# Patient Record
Sex: Male | Born: 1980 | Race: White | Hispanic: No | Smoking: Current every day smoker
Health system: Southern US, Community
[De-identification: ages and names within clinical notes are randomized; demographics above are authoritative.]

## PROBLEM LIST (undated history)

## (undated) DIAGNOSIS — E119 Type 2 diabetes mellitus without complications: Secondary | ICD-10-CM

## (undated) DIAGNOSIS — E162 Hypoglycemia, unspecified: Secondary | ICD-10-CM

## (undated) DIAGNOSIS — I10 Essential (primary) hypertension: Secondary | ICD-10-CM

## (undated) HISTORY — PX: MOUTH SURGERY: SHX715

---

## 2011-08-29 ENCOUNTER — Emergency Department (HOSPITAL_COMMUNITY)
Admission: EM | Admit: 2011-08-29 | Discharge: 2011-08-29 | Disposition: A | Discharge status: Home / Self Care | Payer: Self-pay | Attending: Emergency Medicine | Admitting: Emergency Medicine

## 2011-08-29 ENCOUNTER — Encounter (HOSPITAL_COMMUNITY): Payer: Self-pay | Admitting: Emergency Medicine

## 2011-08-29 DIAGNOSIS — K0889 Other specified disorders of teeth and supporting structures: Secondary | ICD-10-CM

## 2011-08-29 DIAGNOSIS — K089 Disorder of teeth and supporting structures, unspecified: Secondary | ICD-10-CM | POA: Insufficient documentation

## 2011-08-29 MED ORDER — OXYCODONE-ACETAMINOPHEN 5-325 MG PO TABS: ORAL_TABLET | ORAL | Status: AC

## 2011-08-29 NOTE — ED Provider Notes (Signed)
Medical screening examination/treatment/procedure(s) were performed by non-physician practitioner and as supervising physician I was immediately available for consultation/collaboration.    Nilsa Macht L Rika Daughdrill, MD 08/29/11 1818 

## 2011-08-29 NOTE — ED Notes (Signed)
Pt c/o dental pain to upper L side of mouth. Pt has started Amoxicillin for same earlier this week. Pt states pain is now worse and radiates to L ear. Pt states dentist is going to pull tooth. Pt states he has been taking Tylenol for pain, but it is not working.

## 2011-08-29 NOTE — ED Provider Notes (Signed)
History     CSN: [id number]  Arrival date & time 08/29/11  1611   First MD Initiated Contact with Patient 08/29/11 1800      Chief Complaint  Patient presents with  . Dental Pain    (Consider location/radiation/quality/duration/timing/severity/associated sxs/prior treatment) HPI 31 y.o. male in no acute distress complaining of pain to left upper tooth with dental abscess. Patient has been to see his dentist and was given amoxicillin however he was not given any pain medication. He states that pain is now worsening and radiating to the left ear. He denies fever chills cheek swelling change in vision.   History reviewed. No pertinent past medical history.  History reviewed. No pertinent past surgical history.  No family history on file.  History  Substance Use Topics  . Smoking status: Not on file  . Smokeless tobacco: Not on file  . Alcohol Use: Not on file      Review of Systems  Unable to perform ROS Constitutional: Negative for fever.  HENT: Positive for dental problem.   Gastrointestinal: Negative for nausea, vomiting and abdominal pain.  All other systems reviewed and are negative.    Allergies  Ibuprofen and Toradol  Home Medications   Current Outpatient Rx  Name Route Sig Dispense Refill  . AMOXICILLIN 500 MG PO CAPS Oral Take 500 mg by mouth 3 (three) times daily.      BP 139/85  Pulse 81  Temp 98.5 F (36.9 C) (Oral)  Resp 17  SpO2 98%  Physical Exam  Nursing note and vitals reviewed. Constitutional: He is oriented to person, place, and time. He appears well-developed and well-nourished. No distress.  HENT:  Head: Normocephalic and atraumatic.       Significant number of dental caries and blackened  tooth to the upper left side.  Eyes: Conjunctivae and EOM are normal.  Cardiovascular: Normal rate.   Pulmonary/Chest: Effort normal.  Abdominal: Soft.  Musculoskeletal: Normal range of motion.  Neurological: He is alert and oriented to  person, place, and time.  Psychiatric: He has a normal mood and affect.    ED Course  Procedures (including critical care time)  Labs Reviewed - No data to display No results found.   1. Pain, dental       MDM  Dental pain with abscess patient is only taking amoxicillin for the abscess he needs pain control I will write him for 10 5 mg Percocet. He has an appointment with his dentist in 2 weeks. Return precautions given        Wynetta Emery, PA-C 08/29/11 1811

## 2013-07-05 ENCOUNTER — Encounter (HOSPITAL_COMMUNITY): Payer: Self-pay | Admitting: Emergency Medicine

## 2013-07-05 ENCOUNTER — Emergency Department (HOSPITAL_COMMUNITY)
Admission: EM | Admit: 2013-07-05 | Discharge: 2013-07-05 | Disposition: A | Discharge status: Home / Self Care | Payer: Self-pay | Attending: Emergency Medicine | Admitting: Emergency Medicine

## 2013-07-05 DIAGNOSIS — K002 Abnormalities of size and form of teeth: Secondary | ICD-10-CM | POA: Insufficient documentation

## 2013-07-05 DIAGNOSIS — K089 Disorder of teeth and supporting structures, unspecified: Secondary | ICD-10-CM | POA: Insufficient documentation

## 2013-07-05 DIAGNOSIS — Z79899 Other long term (current) drug therapy: Secondary | ICD-10-CM | POA: Insufficient documentation

## 2013-07-05 DIAGNOSIS — K0889 Other specified disorders of teeth and supporting structures: Secondary | ICD-10-CM

## 2013-07-05 DIAGNOSIS — K0501 Acute gingivitis, non-plaque induced: Secondary | ICD-10-CM | POA: Insufficient documentation

## 2013-07-05 DIAGNOSIS — F17200 Nicotine dependence, unspecified, uncomplicated: Secondary | ICD-10-CM | POA: Insufficient documentation

## 2013-07-05 DIAGNOSIS — Z792 Long term (current) use of antibiotics: Secondary | ICD-10-CM | POA: Insufficient documentation

## 2013-07-05 DIAGNOSIS — E119 Type 2 diabetes mellitus without complications: Secondary | ICD-10-CM | POA: Insufficient documentation

## 2013-07-05 DIAGNOSIS — K029 Dental caries, unspecified: Secondary | ICD-10-CM | POA: Insufficient documentation

## 2013-07-05 DIAGNOSIS — K0510 Chronic gingivitis, plaque induced: Secondary | ICD-10-CM

## 2013-07-05 HISTORY — DX: Type 2 diabetes mellitus without complications: E11.9

## 2013-07-05 LAB — CBG MONITORING, ED: Glucose-Capillary: 104 mg/dL — ABNORMAL HIGH (ref 70–99)

## 2013-07-05 MED ORDER — AMOXICILLIN 500 MG PO CAPS: 500.0000 mg | ORAL_CAPSULE | Freq: Three times a day (TID) | ORAL | Status: DC

## 2013-07-05 MED ORDER — OXYCODONE-ACETAMINOPHEN 5-325 MG PO TABS: 1.0000 | ORAL_TABLET | Freq: Three times a day (TID) | ORAL | Status: DC | PRN

## 2013-07-05 MED ORDER — OXYCODONE-ACETAMINOPHEN 5-325 MG PO TABS
1.0000 | ORAL_TABLET | Freq: Once | ORAL | Status: AC
  Administered 2013-07-05: 1 via ORAL
  Filled 2013-07-05: qty 1

## 2013-07-05 NOTE — ED Provider Notes (Signed)
CSN: [id number]     Arrival date & time 07/05/13  1132 History   This chart was scribed for non-physician practitioner Raymon MuttonMarissa Sciacca, PA-C, working with Hurman HornJohn M Bednar, MD, by Yevette EdwardsAngela Bracken, ED Scribe. This patient was seen in room WTR6/WTR6 and the patient's care was started at 12:45 PM.  First MD Initiated Contact with Patient 07/05/13 1142     Chief Complaint  Patient presents with  . Dental Pain    The history is provided by the patient. No language interpreter was used.   HPI Comments Vincent Cannon is a 33 y.o. male, with a h/o DM, who presents to the Emergency Department complaining of upper frontal dental pain which has persisted for three days, and worsened this morning. He characterizes the pain as "throbbing," and he rates the pain as 8/10.  He denies recent injury to the affected tooth, though he states that his teeth have been discolored for approximately a year. He denies fever, chills, nausea, emesis, neck pain, neck stiffness, facial swelling, gum swelling, bleeding or drainage from the site, difficulty swallowing, voice changes, or throat swelling. He has used an old prescription of amoxacillin, tylenol, and Goody's power without relief. The pt does not have dental insurance and cannot afford a dental visit until his insurance plan is enacted; his last visit was approximately 18 months ago.  Vincent Cannon is a current smoker.   Past Medical History  Diagnosis Date  . Diabetes mellitus without complication    No past surgical history on file. No family history on file. History  Substance Use Topics  . Smoking status: Current Every Day Smoker -- 1.00 packs/day    Types: Cigarettes  . Smokeless tobacco: Never Used  . Alcohol Use: No    Review of Systems  Constitutional: Negative for fever and chills.  HENT: Positive for dental problem. Negative for drooling, facial swelling, sore throat, trouble swallowing and voice change.   Gastrointestinal: Negative for nausea and  vomiting.  Musculoskeletal: Negative for neck pain and neck stiffness.  All other systems reviewed and are negative.   Allergies  Ibuprofen and Toradol  Home Medications   Prior to Admission medications   Medication Sig Start Date End Date Taking? Authorizing Provider  amoxicillin (AMOXIL) 500 MG capsule Take 1 capsule (500 mg total) by mouth 3 (three) times daily. 07/05/13   Marissa Sciacca, PA-C  oxyCODONE-acetaminophen (PERCOCET/ROXICET) 5-325 MG per tablet Take 1 tablet by mouth every 8 (eight) hours as needed for moderate pain or severe pain. 07/05/13   Marissa Sciacca, PA-C   Triage Vitals: BP 135/101  Pulse 99  Temp(Src) 98.1 F (36.7 C) (Oral)  Resp 18  SpO2 100%  Physical Exam  Nursing note and vitals reviewed. Constitutional: He is oriented to person, place, and time. He appears well-developed and well-nourished. No distress.  HENT:  Head: Normocephalic and atraumatic.  Mouth/Throat: Uvula is midline and oropharynx is clear and moist. He does not have dentures. No oral lesions. No trismus in the jaw. Abnormal dentition. Dental caries present. No dental abscesses, uvula swelling or lacerations. No oropharyngeal exudate, posterior oropharyngeal edema, posterior oropharyngeal erythema or tonsillar abscesses.    Poor dentition identified with numerous teeth in the maxillary jawline rotting and decayed. Bilateral central incisors, bilateral lateral incisors, bilateral canines of maxillary jawline noted to be diagrammed, and decayed. Mild swelling identified to the maxillary jawline with negative active drainage or bleeding noted. Negative erythema identified. Negative sublingual lesions. Uvula midline with symmetrical elevation-negative uvula swelling. Negative trismus.  Eyes: Conjunctivae and EOM are normal. Pupils are equal, round, and reactive to light. Right eye exhibits no discharge. Left eye exhibits no discharge.  Neck: Normal range of motion. Neck supple. No tracheal  deviation present.  Negative neck stiffness Negative nuchal rigidity Negative cervical lymphadenopathy  Negative meningeal signs   Cardiovascular: Normal rate, regular rhythm and normal heart sounds.  Exam reveals no friction rub.   No murmur heard. Pulmonary/Chest: Effort normal and breath sounds normal. No respiratory distress. He has no wheezes. He has no rales.  Patient is able to speak in full sentences without difficulty Negative use of accessory muscles Negative stridor  Musculoskeletal: Normal range of motion. He exhibits no tenderness.  Full ROM to upper and lower extremities without difficulty noted, negative ataxia noted.  Lymphadenopathy:    He has no cervical adenopathy.  Neurological: He is alert and oriented to person, place, and time. No cranial nerve deficit. He exhibits normal muscle tone. Coordination normal.  Cranial nerves III-XII grossly intact  Skin: Skin is warm and dry.  Psychiatric: He has a normal mood and affect. His behavior is normal.    ED Course  Procedures (including critical care time)  DIAGNOSTIC STUDIES: Oxygen Saturation is 100% on room air, normal by my interpretation.    COORDINATION OF CARE:  12:52 PM- Discussed treatment plan with patient, and the patient agreed to the plan. The plan includes a prescription for amoxicillin and pain medication. Also provide pt referral to a dentist and oral surgeon.   Labs Review Labs Reviewed  CBG MONITORING, ED - Abnormal; Notable for the following:    Glucose-Capillary 104 (*)    All other components within normal limits    Imaging Review No results found.   EKG Interpretation None      MDM   Final diagnoses:  Pain, dental  Gingivitis    Medications  oxyCODONE-acetaminophen (PERCOCET/ROXICET) 5-325 MG per tablet 1 tablet (1 tablet Oral Given 07/05/13 1301)   Filed Vitals:   07/05/13 1139  BP: 135/101  Pulse: 99  Temp: 98.1 F (36.7 C)  TempSrc: Oral  Resp: 18  SpO2: 100%   I  personally performed the services described in this documentation, which was scribed in my presence. The recorded information has been reviewed and is accurate.  Doubt Ludwig's angina. Doubt peritonsillar abscess. Doubt retropharyngeal abscess. Patient presenting to the ED with dental pain secondary to poor dental hygiene. Numerous teeth identified to the maxillary region that are diagrammed and decaying. Patient stable, afebrile. Patient not septic appearing. Discharged patient. [facility]. Discharged patient with antibiotics and pain medications-discussed course, precautions, disposal technique. Discussed with patient to closely monitor symptoms and if symptoms are to worsen or change to report back to the ED - strict return instructions given.  Patient agreed to plan of care, understood, all questions answered.   Raymon MuttonMarissa Sciacca, PA-C 07/05/13 2118

## 2013-07-05 NOTE — ED Notes (Signed)
Pt has dental pain that has increased over the last several days. Pt does not have a dentist at this time due to insurance.

## 2013-07-05 NOTE — Discharge Instructions (Signed)
Please call and set up an appointment with the dentist Please rest and stay hydrated Please apply cool compressions Please take medications, antibiotics as prescribed and on a full stomach Please take pain medications as prescribed rash on pain medications his be no drinking alcohol, driving, operating any heavy machinery. If there is extra please disposer proper manner. Please do not take any extra Tylenol with this medication for this can lead to Tylenol overdose and liver issues. Please continue to monitor symptoms closely and if symptoms are to worsen or change (fever greater than 101, chills, chest pain, shortness of breath, difficulty breathing, numbness, tingling, worsening or changes to pain pattern, swelling, redness to the skin, pus drainage, bleeding, inability to swallow, throat closing sensation, inability to open and close the jaw line, neck pain, neck swelling) please report back to the ED immediately    Dental Pain A tooth ache may be caused by cavities (tooth decay). Cavities expose the nerve of the tooth to air and hot or cold temperatures. It may come from an infection or abscess (also called a boil or furuncle) around your tooth. It is also often caused by dental caries (tooth decay). This causes the pain you are having. DIAGNOSIS  Your caregiver can diagnose this problem by exam. TREATMENT   If caused by an infection, it may be treated with medications which kill germs (antibiotics) and pain medications as prescribed by your caregiver. Take medications as directed.  Only take over-the-counter or prescription medicines for pain, discomfort, or fever as directed by your caregiver.  Whether the tooth ache today is caused by infection or dental disease, you should see your dentist as soon as possible for further care. SEEK MEDICAL CARE IF: The exam and treatment you received today has been provided on an emergency basis only. This is not a substitute for complete medical or  dental care. If your problem worsens or new problems (symptoms) appear, and you are unable to meet with your dentist, call or return to this location. SEEK IMMEDIATE MEDICAL CARE IF:   You have a fever.  You develop redness and swelling of your face, jaw, or neck.  You are unable to open your mouth.  You have severe pain uncontrolled by pain medicine. MAKE SURE YOU:   Understand these instructions.  Will watch your condition.  Will get help right away if you are not doing well or get worse. Document Released: 12/24/2004 Document Revised: 03/18/2011 Document Reviewed: 08/12/2007 [facility] Patient Information 2015 [place], [place]. This information is not intended to replace advice given to you by your health care provider. Make sure you discuss any questions you have with your health care provider.  Dental Care and Dentist Visits Dental care supports good overall health. Regular dental visits can also help you avoid dental pain, bleeding, infection, and other more serious health problems in the future. It is important to keep the mouth healthy because diseases in the teeth, gums, and other oral tissues can spread to other areas of the body. Some problems, such as diabetes, heart disease, and pre-term labor have been associated with poor oral health.  See your dentist every 6 months. If you experience emergency problems such as a toothache or broken tooth, go to the dentist right away. If you see your dentist regularly, you may catch problems early. It is easier to be treated for problems in the early stages.  WHAT TO EXPECT AT A DENTIST VISIT  Your dentist will look for many common oral health problems  and recommend proper treatment. At your regular dental visit, you can expect:  Gentle cleaning of the teeth and gums. This includes scraping and polishing. This helps to remove the sticky substance around the teeth and gums (plaque). Plaque forms in the mouth shortly after eating. Over time,  plaque hardens on the teeth as tartar. If tartar is not removed regularly, it can cause problems. Cleaning also helps remove stains.  Periodic X-rays. These pictures of the teeth and supporting bone will help your dentist assess the health of your teeth.  Periodic fluoride treatments. Fluoride is a natural mineral shown to help strengthen teeth. Fluoride treatmentinvolves applying a fluoride gel or varnish to the teeth. It is most commonly done in children.  Examination of the mouth, tongue, jaws, teeth, and gums to look for any oral health problems, such as:  Cavities (dental caries). This is decay on the tooth caused by plaque, sugar, and acid in the mouth. It is best to catch a cavity when it is small.  Inflammation of the gums caused by plaque buildup (gingivitis).  Problems with the mouth or malformed or misaligned teeth.  Oral cancer or other diseases of the soft tissues or jaws. KEEP YOUR TEETH AND GUMS HEALTHY For healthy teeth and gums, follow these general guidelines as well as your dentist's specific advice:  Have your teeth professionally cleaned at the dentist every 6 months.  Brush twice daily with a fluoride toothpaste.  Floss your teeth daily.  Ask your dentist if you need fluoride supplements, treatments, or fluoride toothpaste.  Eat a healthy diet. Reduce foods and drinks with added sugar.  Avoid smoking. TREATMENT FOR ORAL HEALTH PROBLEMS If you have oral health problems, treatment varies depending on the conditions present in your teeth and gums.  Your caregiver will most likely recommend good oral hygiene at each visit.  For cavities, gingivitis, or other oral health disease, your caregiver will perform a procedure to treat the problem. This is typically done at a separate appointment. Sometimes your caregiver will refer you to another dental specialist for specific tooth problems or for surgery. SEEK IMMEDIATE DENTAL CARE IF:  You have pain, bleeding, or  soreness in the gum, tooth, jaw, or mouth area.  A permanent tooth becomes loose or separated from the gum socket.  You experience a blow or injury to the mouth or jaw area. Document Released: 09/05/2010 Document Revised: 03/18/2011 Document Reviewed: 09/05/2010 [facility]Exit[facility] Patient Information 2015 [place]ExitCare, [place]LLC. This information is not intended to replace advice given to you by your health care provider. Make sure you discuss any questions you have with your health care provider.   Emergency Department Resource Guide 1) Find a Doctor and Pay Out of Pocket Although you won't have to find out who is covered by your insurance plan, it is a good idea to ask around and get recommendations. You will then need to call the office and see if the doctor you have chosen will accept you as a new patient and what types of options they offer for patients who are self-pay. Some doctors offer discounts or will set up payment plans for their patients who do not have insurance, but you will need to ask so you aren't surprised when you get to your appointment.  2) Contact Your Local Health Department Not all health departments have doctors that can see patients for sick visits, but many do, so it is worth a call to see if yours does. If you don't know where your local  health department is, you can check in your phone book. The CDC also has a tool to help you locate your state's health department, and many state websites also have listings of all of their local health departments.  3) Find a Walk-in Clinic If your illness is not likely to be very severe or complicated, you may want to try a walk in clinic. These are popping up all over the country in pharmacies, drugstores, and shopping centers. They're usually staffed by nurse practitioners or physician assistants that have been trained to treat common illnesses and complaints. They're usually fairly quick and inexpensive. However, if you have serious medical issues  or chronic medical problems, these are probably not your best option.  No Primary Care Doctor: - Call Health Connect at  [phone] - they can help you locate a primary care doctor that  accepts your insurance, provides certain services, etc. - Physician Referral Service- [phone]  Chronic Pain Problems: Organization         Address  Phone   Notes  [facility]  [phone] Patients need to be referred by their primary care doctor.   Medication Assistance: Organization         Address  Phone   Notes  [facility] Medication [facility] [address] [place] [place], [place] [address] [phone] --Must be a resident of [facility] -- Must have NO insurance coverage whatsoever (no Medicaid/ Medicare, etc.) -- The pt. MUST have a primary care doctor that directs their care regularly and follows them in the community   [facility]  [phone]   [employer]  [phone]    Agencies that provide inexpensive medical care: Organization         Address  Phone   Notes  [facility]  [phone]   [facility]    [phone]   [facility] [address] [place], [place] [address] [phone]   [facility] [address], [place] [phone]   Planned Parenthood    [phone]   [facility]    [phone]   [facility]  [address], [place] Phone:  [phone], Fax:  [phone] Hours of Operation:  9 am - 6 pm, M-F.  Also accepts Medicaid/Medicare and self-pay.  [facility]  [address], [place] Phone: [phone], Fax: [phone]. Hours of Operation:  8:30 am - 5:30 pm, M-F.  Also accepts Medicaid and self-pay.  [facility] [place] [address], [place] Phone: [phone]   [facility] [address] Natasha Bence [place], [place]  [phone], Ext. [phone] Mondays & Thursdays: 7-9 AM.  First 15 patients are seen on a first come, first serve basis.    Medicaid-accepting [facility] Providers:  Organization         Address  Phone   Notes  [facility] [address], [place] [phone] Also accepts self-pay patients.  [facility] [address] Laurell Josephs [place], [place]  [phone]   [facility] [address], [place] [phone]   [facility] [address], [place] [phone]   Renaye Rakers [address], [place]   [phone] Only accepts [place] Access [place] patients after they  have their name applied to their card.   Self-Pay (no insurance) in [facility]:  Organization         Address  Phone   Notes  Sickle Cell Patients, [facility] Internal Medicine [address] [place], [place] [phone]   [facility] [address] [place], [place] [phone]   [facility] [place]  [address], [place] [phone]   [facility]/Dr. Osei-Bonsu  [address] [place] or [address], [place] [phone] Phone number for both [place] and [place] locations is the same.  [facility]gent Medical and [facility] [address] [place] [phone]   [facility] [address] [place] or [address] [phone]   [facility] [address], [place] [phone], phone; [phone], fax Sees patients 1st and 3rd Saturday of every month.  Must not qualify for public or private insurance (i.e. Medicaid, Medicare, [place] Health Choice, Veterans' Benefits)  Household income should be no more than 200% of the poverty level The clinic cannot treat you if you are pregnant or think you are pregnant  Sexually transmitted  diseases are not treated at the clinic.    Dental Care: Organization         Address  Phone  Notes  [facility] Department of [facility] [address] Eugenio Saenz, [place] [phone] Accepts children up to age 13 who are enrolled in [place] or [place] Health Choice; pregnant women with a Medicaid card; and children who have applied for Medicaid or [place] Health Choice, but were declined, whose parents can pay a reduced fee at time of service.  [facility] Department of [facility]  [address], [place] [phone] Accepts children up to age 25 who are enrolled in [place] or [place] Health Choice; pregnant women with a Medicaid card; and children who have applied for Medicaid or [place] Health Choice, but were declined, whose parents can pay a reduced fee at time of service.  [facility]  [address] [place], [place] [phone] Patients are seen by appointment only. Walk-ins are not accepted. [facility] will see patients 54 years of age and older. Monday - Tuesday (8am-5pm) Most Wednesdays (8:30-5pm) $30 per visit, cash only  [facility] Adult Dental Access PROGRAM  [address], [place] [facility] [phone] Patients are seen by appointment only. Walk-ins are not accepted. [facility] will see patients 32 years of age and older. One Wednesday Evening (Monthly: Volunteer Based).  $30 per visit, cash only  [employer]  [phone] for adults; Children under age 63, call Graduate Pediatric Dentistry at [phone]. Children aged 74-14, please call [phone] to request a pediatric application.  Dental services are provided in all areas of dental care including fillings, crowns and bridges, complete and partial dentures, implants, gum treatment, root canals, and extractions. Preventive care is also provided. Treatment is provided to both adults and children. Patients are selected via a  lottery and there is often a waiting list.   [facility] [address], [place]  [phone] [email or web address]   [facility] [address] [place], [place] [phone], Ext. [phone] Second and Fourth Thursday of each month, opens at 6:30 AM; Clinic ends at 9 AM.  Patients are seen  on a first-come first-served basis, and a limited number are seen during each clinic.   [facility]  [address] Ether Griffins [place], [place] [phone]   Eligibility Requirements You must have lived in [place], [place], or [place] counties for at least the last three months.   You cannot be eligible for state or federal sponsored [place], including [employer], [place], or [employer].   You generally cannot be eligible for healthcare insurance through your employer.    How to apply: Eligibility screenings are held every Tuesday and Wednesday afternoon from 1:00 pm until 4:00 pm. You do not need an appointment for the interview!  [facility] [address], [place], [place] [phone]   [facility] Health Department  [phone]   [facility] Health Department  [phone]   [facility] Health Department  [phone]    Behavioral Health Resources in the Community: Intensive Outpatient Programs Organization         Address  Phone  Notes  [facility] [address] [place], [place] [phone]   [facility] [address], [place], [place] [phone]   [facility] [address] [place], [place]  [phone]   [facility] Mental Health [address]  [place], [place] [phone] or [phone]   Substance Abuse Resources Organization         Address  Phone  Notes  [facility]  [phone]   [facility]  [phone]   [facility]  [phone]   Floydene Flock  [phone]   Residential &  Outpatient Substance Abuse Program  [phone]   Psychological Services Organization         Address  Phone  Notes  [facility] Behavioral Health  [phone]   [facility] Services  [phone]   [facility] Health [address], [place] [phone] or [phone]    Mobile Crisis Teams Organization         Address  Phone  Notes  [facility], Mobile Crisis Care Unit  [phone]   [facility]  [address] [place], [place] [phone]   Doristine Locks [address] [place] [phone]    Self-Help/Support Groups Organization         Address  Phone             Notes  [facility] [place] - variety of support groups  336- I7437963 Call for more information  Narcotics Anonymous (NA), [facility] [address], [employer] [place]  2 meetings at this location   Statistician         Address  Phone  Notes  [facility] [address],    [place]  [phone]   [facility]  [address] [place] [address], [place], [place] [phone]   [facility] [address] [place], [place] [phone] Admissions: 8am-3pm M-F  [facility] [address],    [place], [place] [phone]   [facility] [address] Starling Manns [place], [place] [phone]   [facility] [address]  [place], [place] [phone]   [facility] - Intensive Outpatient [address]ure[address], [place], [place] [phone]   [facility] ([facility] [address] [place]  [place], [place] [phone] or [phone]   [facility] ([facility]) [address], [place], [place]  [phone] Accepts Medicaid  [facility] [address],  [place] [phone] Substance Abuse/Addiction Treatment   [facility] Organization          Address  Phone  Notes  [facility]  [phone]   Angie Fava, PhD [address] Ervin Knack [place], [place]   [phone] or [phone]   [facility] Behavioral   [address] [place], [place] [phone]   [facility] [address] [place], [place] [phone] Insurance/Medicaid/sponsorship through [facility] and Families [address]                                    [place], [place] [phone] Therapy/tele-psych/case  [facility] [address], [place] [phone]    Dr. Lolly Mustache  [phone]   [facility]  [employer] [facility] Dept. 1) [address], [place] 2) [address] [place] 3)  [address], [place] [phone]  [phone]   [facility] Child Abuse Hotline (443)321-5509 or [phone] (After Hours)

## 2013-07-07 NOTE — ED Provider Notes (Signed)
Medical screening examination/treatment/procedure(s) were performed by non-physician practitioner and as supervising physician I was immediately available for consultation/collaboration.   EKG Interpretation None       Hurman HornJohn M Bednar, MD 07/07/13 2138

## 2013-09-02 ENCOUNTER — Emergency Department (HOSPITAL_COMMUNITY)
Admission: EM | Admit: 2013-09-02 | Discharge: 2013-09-02 | Disposition: A | Discharge status: Home / Self Care | Payer: Self-pay | Attending: Emergency Medicine | Admitting: Emergency Medicine

## 2013-09-02 ENCOUNTER — Encounter (HOSPITAL_COMMUNITY): Payer: Self-pay | Admitting: Emergency Medicine

## 2013-09-02 DIAGNOSIS — M546 Pain in thoracic spine: Secondary | ICD-10-CM | POA: Insufficient documentation

## 2013-09-02 DIAGNOSIS — F17200 Nicotine dependence, unspecified, uncomplicated: Secondary | ICD-10-CM | POA: Insufficient documentation

## 2013-09-02 DIAGNOSIS — M62838 Other muscle spasm: Secondary | ICD-10-CM | POA: Insufficient documentation

## 2013-09-02 DIAGNOSIS — E119 Type 2 diabetes mellitus without complications: Secondary | ICD-10-CM | POA: Insufficient documentation

## 2013-09-02 DIAGNOSIS — Z792 Long term (current) use of antibiotics: Secondary | ICD-10-CM | POA: Insufficient documentation

## 2013-09-02 MED ORDER — OXYCODONE-ACETAMINOPHEN 5-325 MG PO TABS: 1.0000 | ORAL_TABLET | Freq: Three times a day (TID) | ORAL | Status: DC | PRN

## 2013-09-02 MED ORDER — OXYCODONE-ACETAMINOPHEN 5-325 MG PO TABS
2.0000 | ORAL_TABLET | Freq: Once | ORAL | Status: AC
  Administered 2013-09-02: 2 via ORAL
  Filled 2013-09-02: qty 2

## 2013-09-02 MED ORDER — CYCLOBENZAPRINE HCL 10 MG PO TABS: 10.0000 mg | ORAL_TABLET | Freq: Two times a day (BID) | ORAL | Status: DC | PRN

## 2013-09-02 NOTE — ED Provider Notes (Signed)
CSN: [id number]     Arrival date & time 09/02/13  1221 History  This chart was scribed for non-physician practitioner, Arthor Captain PA-C, working with Glynn Octave, MD by Milly Jakob, ED Scribe. The patient was seen in room WTR8/WTR8. Patient's care was started at 12:52 PM.     No chief complaint on file.  The history is provided by the patient. No language interpreter was used.   HPI Comments: Vincent Cannon is a 33 y.o. male who presents to the Emergency Department complaining of sharp, stabbing, upper lumbar back pain. He states that he was working in the yard and the pain began when he was lifting wood. He rates is pan as a 9/10 and states that it is exacerbated to 10/10 upon movement or cough. He denies radiation into his legs.  He reports using warm and cold compress with minimal relief. He denies fevers, chills, headaches, rash, IV drug use, bowel or bladder incontinence, rhinorrhea, sore throat, and  abdominal pain. He denies chronic back pain or previous back injury. He does not have a PCP.  Past Medical History  Diagnosis Date  . Diabetes mellitus without complication    Past Surgical History  Procedure Laterality Date  . Mouth surgery     Family History  Problem Relation Age of Onset  . Diabetes Mother   . Hypertension Mother   . Hypertension Father   . Diabetes Father    History  Substance Use Topics  . Smoking status: Current Every Day Smoker -- 1.00 packs/day    Types: Cigarettes  . Smokeless tobacco: Never Used  . Alcohol Use: No    Review of Systems  Constitutional: Negative for fever and chills.  HENT: Negative for congestion, rhinorrhea and sore throat.   Gastrointestinal: Negative for abdominal pain.  Musculoskeletal: Positive for back pain.  Skin: Negative for rash and wound.  Neurological: Negative for headaches.   Allergies  Ibuprofen and Toradol  Home Medications   Prior to Admission medications   Medication Sig Start Date End Date  Taking? Authorizing Provider  amoxicillin (AMOXIL) 500 MG capsule Take 1 capsule (500 mg total) by mouth 3 (three) times daily. 07/05/13   Marissa Sciacca, PA-C  oxyCODONE-acetaminophen (PERCOCET/ROXICET) 5-325 MG per tablet Take 1 tablet by mouth every 8 (eight) hours as needed for moderate pain or severe pain. 07/05/13   Marissa Sciacca, PA-C   There were no vitals taken for this visit. Physical Exam  Nursing note and vitals reviewed. Constitutional: He is oriented to person, place, and time. He appears well-developed and well-nourished. No distress.  HENT:  Head: Normocephalic and atraumatic.  Eyes: Conjunctivae and EOM are normal.  Neck: Neck supple. No tracheal deviation present.  Cardiovascular: Normal rate.   Pulmonary/Chest: Effort normal. No respiratory distress.  Musculoskeletal: Normal range of motion.  No midline tenderness. Tender in the thoracic paraspinal and his tenderness is greatest at the right lower trapezius fiber insertion. There is palpable spasm. He sits in a guarded position. Toro movement is decreased due to pain and spastic tissue.  Neurological: He is alert and oriented to person, place, and time. He has normal reflexes.  Skin: Skin is warm and dry.  Psychiatric: He has a normal mood and affect. His behavior is normal.    ED Course  Procedures (including critical care time)  12:56 PM-Discussed treatment plan which includes pain medication and a muscle relaxer with pt at bedside and pt agreed to plan.   Labs Review Labs Reviewed - No  data to display  Imaging Review No results found.   EKG Interpretation None      MDM   Final diagnoses:  Trapezius muscle spasm    Patient with back pain.  No neurological deficits and normal neuro exam.  Patient can walk but states is painful.  No loss of bowel or bladder control.  No concern for cauda equina.  No fever, night sweats, weight loss, h/o cancer, IVDU.  RICE protocol and pain medicine indicated and  discussed with patient.   I personally performed the services described in this documentation, which was scribed in my presence. The recorded information has been reviewed and is accurate.   Arthor Captain, PA-C 09/02/13 1308

## 2013-09-02 NOTE — ED Notes (Signed)
Pt c/o mid to upper back pain 24 hours after lifting wood. Denies chronic pain. Treated with OTC medication

## 2013-09-02 NOTE — ED Provider Notes (Signed)
Medical screening examination/treatment/procedure(s) were performed by non-physician practitioner and as supervising physician I was immediately available for consultation/collaboration.   EKG Interpretation None        Keirston Saephanh, MD 09/02/13 1421 

## 2013-09-02 NOTE — Discharge Instructions (Signed)
SEEK IMMEDIATE MEDICAL ATTENTION IF: New numbness, tingling, weakness, or problem with the use of your arms or legs.  Severe back pain not relieved with medications.  Change in bowel or bladder control.  Increasing pain in any areas of the body (such as chest or abdominal pain).  Shortness of breath, dizziness or fainting.  Nausea (feeling sick to your stomach), vomiting, fever, or sweats.  Lumbosacral Strain Lumbosacral strain is a strain of any of the parts that make up your lumbosacral vertebrae. Your lumbosacral vertebrae are the bones that make up the lower third of your backbone. Your lumbosacral vertebrae are held together by muscles and tough, fibrous tissue (ligaments).  CAUSES  A sudden blow to your back can cause lumbosacral strain. Also, anything that causes an excessive stretch of the muscles in the low back can cause this strain. This is typically seen when people exert themselves strenuously, fall, lift heavy objects, bend, or crouch repeatedly. RISK FACTORS  Physically demanding work.  Participation in pushing or pulling sports or sports that require a sudden twist of the back (tennis, golf, baseball).  Weight lifting.  Excessive lower back curvature.  Forward-tilted pelvis.  Weak back or abdominal muscles or both.  Tight hamstrings. SIGNS AND SYMPTOMS  Lumbosacral strain may cause pain in the area of your injury or pain that moves (radiates) down your leg.  DIAGNOSIS Your health care provider can often diagnose lumbosacral strain through a physical exam. In some cases, you may need tests such as X-ray exams.  TREATMENT  Treatment for your lower back injury depends on many factors that your clinician will have to evaluate. However, most treatment will include the use of anti-inflammatory medicines. HOME CARE INSTRUCTIONS   Avoid hard physical activities (tennis, racquetball, waterskiing) if you are not in proper physical condition for it. This may aggravate or  create problems.  If you have a back problem, avoid sports requiring sudden body movements. Swimming and walking are generally safer activities.  Maintain good posture.  Maintain a healthy weight.  For acute conditions, you may put ice on the injured area.  Put ice in a plastic bag.  Place a towel between your skin and the bag.  Leave the ice on for 20 minutes, 2-3 times a day.  When the low back starts healing, stretching and strengthening exercises may be recommended. SEEK MEDICAL CARE IF:  Your back pain is getting worse.  You experience severe back pain not relieved with medicines. SEEK IMMEDIATE MEDICAL CARE IF:   You have numbness, tingling, weakness, or problems with the use of your arms or legs.  There is a change in bowel or bladder control.  You have increasing pain in any area of the body, including your belly (abdomen).  You notice shortness of breath, dizziness, or feel faint.  You feel sick to your stomach (nauseous), are throwing up (vomiting), or become sweaty.  You notice discoloration of your toes or legs, or your feet get very cold. MAKE SURE YOU:   Understand these instructions.  Will watch your condition.  Will get help right away if you are not doing well or get worse. Document Released: 10/03/2004 Document Revised: 12/29/2012 Document Reviewed: 08/12/2012 [facility] Patient Information 2015 [place], [place]. This information is not intended to replace advice given to you by your health care provider. Make sure you discuss any questions you have with your health care provider.  Heat Therapy Heat therapy can help make painful, stiff muscles and joints feel better. Do not use  heat on new injuries. Wait at least 48 hours after an injury to use heat. Do not use heat when you have aches or pains right after an activity. If you still have pain 3 hours after stopping the activity, then you may use heat. HOME CARE Wet heat pack  Soak a clean towel in warm  water. Squeeze out the extra water.  Put the warm, wet towel in a plastic bag.  Place a thin, dry towel between your skin and the bag.  Put the heat pack on the area for 5 minutes, and check your skin. Your skin may be pink, but it should not be red.  Leave the heat pack on the area for 15 to 30 minutes.  Repeat this every 2 to 4 hours while awake. Do not use heat while you are sleeping. Warm water bath  Fill a tub with warm water.  Place the affected body part in the tub.  Soak the area for 20 to 40 minutes.  Repeat as needed. Hot water bottle  Fill the water bottle half full with hot water.  Press out the extra air. Close the cap tightly.  Place a dry towel between your skin and the bottle.  Put the bottle on the area for 5 minutes, and check your skin. Your skin may be pink, but it should not be red.  Leave the bottle on the area for 15 to 30 minutes.  Repeat this every 2 to 4 hours while awake. Electric heating pad  Place a dry towel between your skin and the heating pad.  Set the heating pad on low heat.  Put the heating pad on the area for 10 minutes, and check your skin. Your skin may be pink, but it should not be red.  Leave the heating pad on the area for 20 to 40 minutes.  Repeat this every 2 to 4 hours while awake.  Do not lie on the heating pad.  Do not fall asleep while using the heating pad.  Do not use the heating pad near water. GET HELP RIGHT AWAY IF:  You get blisters or red skin.  Your skin is puffy (swollen), or you lose feeling (numbness) in the affected area.  You have any new problems.  Your problems are getting worse.  You have any questions or concerns. If you have any problems, stop using heat therapy until you see your doctor. MAKE SURE YOU:  Understand these instructions.  Will watch your condition.  Will get help right away if you are not doing well or get worse. Document Released: 03/18/2011 Document Reviewed:  02/16/2013 [facility] Patient Information 2015 [place], [place]. This information is not intended to replace advice given to you by your health care provider. Make sure you discuss any questions you have with your health care provider.  Muscle Cramps and Spasms Muscle cramps and spasms occur when a muscle or muscles tighten and you have no control over this tightening (involuntary muscle contraction). They are a common problem and can develop in any muscle. The most common place is in the calf muscles of the leg. Both muscle cramps and muscle spasms are involuntary muscle contractions, but they also have differences:   Muscle cramps are sporadic and painful. They may last a few seconds to a quarter of an hour. Muscle cramps are often more forceful and last longer than muscle spasms.  Muscle spasms may or may not be painful. They may also last just a few seconds or much  longer. CAUSES  It is uncommon for cramps or spasms to be due to a serious underlying problem. In many cases, the cause of cramps or spasms is unknown. Some common causes are:   Overexertion.   Overuse from repetitive motions (doing the same thing over and over).   Remaining in a certain position for a long period of time.   Improper preparation, form, or technique while performing a sport or activity.   Dehydration.   Injury.   Side effects of some medicines.   Abnormally low levels of the salts and ions in your blood (electrolytes), especially potassium and calcium. This could happen if you are taking water pills (diuretics) or you are pregnant.  Some underlying medical problems can make it more likely to develop cramps or spasms. These include, but are not limited to:   Diabetes.   Parkinson disease.   Hormone disorders, such as thyroid problems.   Alcohol abuse.   Diseases specific to muscles, joints, and bones.   Blood vessel disease where not enough blood is getting to the muscles.  HOME CARE  INSTRUCTIONS   Stay well hydrated. Drink enough water and fluids to keep your urine clear or pale yellow.  It may be helpful to massage, stretch, and relax the affected muscle.  For tight or tense muscles, use a warm towel, heating pad, or hot shower water directed to the affected area.  If you are sore or have pain after a cramp or spasm, applying ice to the affected area may relieve discomfort.  Put ice in a plastic bag.  Place a towel between your skin and the bag.  Leave the ice on for 15-20 minutes, 03-04 times a day.  Medicines used to treat a known cause of cramps or spasms may help reduce their frequency or severity. Only take over-the-counter or prescription medicines as directed by your caregiver. SEEK MEDICAL CARE IF:  Your cramps or spasms get more severe, more frequent, or do not improve over time.  MAKE SURE YOU:   Understand these instructions.  Will watch your condition.  Will get help right away if you are not doing well or get worse. Document Released: 06/15/2001 Document Revised: 04/20/2012 Document Reviewed: 12/11/2011 [facility] Patient Information 2015 [place], [place]. This information is not intended to replace advice given to you by your health care provider. Make sure you discuss any questions you have with your health care provider.   Emergency Department Resource Guide 1) Find a Doctor and Pay Out of Pocket Although you won't have to find out who is covered by your insurance plan, it is a good idea to ask around and get recommendations. You will then need to call the office and see if the doctor you have chosen will accept you as a new patient and what types of options they offer for patients who are self-pay. Some doctors offer discounts or will set up payment plans for their patients who do not have insurance, but you will need to ask so you aren't surprised when you get to your appointment.  2) Contact Your Local Health Department Not all health  departments have doctors that can see patients for sick visits, but many do, so it is worth a call to see if yours does. If you don't know where your local health department is, you can check in your phone book. The CDC also has a tool to help you locate your state's health department, and many state websites also have listings of all of  their local health departments.  3) Find a Walk-in Clinic If your illness is not likely to be very severe or complicated, you may want to try a walk in clinic. These are popping up all over the country in pharmacies, drugstores, and shopping centers. They're usually staffed by nurse practitioners or physician assistants that have been trained to treat common illnesses and complaints. They're usually fairly quick and inexpensive. However, if you have serious medical issues or chronic medical problems, these are probably not your best option.  No Primary Care Doctor: - Call Health Connect at  [phone] - they can help you locate a primary care doctor that  accepts your insurance, provides certain services, etc. - Physician Referral Service- [phone]  Chronic Pain Problems: Organization         Address  Phone   Notes  [facility]  [phone] Patients need to be referred by their primary care doctor.   Medication Assistance: Organization         Address  Phone   Notes  [facility] Medication [facility] [address] [place] [place], [place] [address] [phone] --Must be a resident of [facility] -- Must have NO insurance coverage whatsoever (no Medicaid/ Medicare, etc.) -- The pt. MUST have a primary care doctor that directs their care regularly and follows them in the community   [facility]  [phone]   [employer]  [phone]    Agencies that provide inexpensive medical care: Organization         Address  Phone   Notes  [facility]  [phone]   [facility]     [phone]   [facility] [address] [place], [place] [address] [phone]   [facility] [address], [place] [phone]   Planned Parenthood    [phone]   [facility]    [phone]   [facility]  [address], [place] Phone:  [phone], Fax:  [phone] Hours of Operation:  9 am - 6 pm, M-F.  Also accepts Medicaid/Medicare and self-pay.  [facility]  [address], [place] Phone: [phone], Fax: [phone]. Hours of Operation:  8:30 am - 5:30 pm, M-F.  Also accepts Medicaid and self-pay.  [facility] [place] [address], [place] Phone: [phone]   [facility] [address] Natasha Bence [place], [place] [phone], Ext. [phone] Mondays & Thursdays: 7-9 AM.  First 15 patients are seen on a first come, first serve basis.    Medicaid-accepting [facility] Providers:  Organization         Address  Phone   Notes  [facility] [address], [place] [phone] Also accepts self-pay patients.  [facility] [address] [place] [address] Laurell Josephs [employer], [place]  [phone]   [facility] [address], [place] [phone]   [facility] [address], [place] [phone]   Renaye Rakers [address], [place]   [phone] Only accepts [place] Access [place] patients after they have their name applied to their card.   Self-Pay (no insurance) in [facility]:  Organization         Address  Phone   Notes  Sickle Cell Patients,  [facility] [address] [place], [place] [phone]   [facility] [address] [place], [place] [phone]   [facility]  [address], [place] [phone]     [facility]/Dr. Osei-Bonsu  [address], [place] or [address], [place] [phone] Phone number for both [place] and [place] locations is the same.  [facility] [address] [place] [phone]   [facility] [address] [place] or [address] [phone]   [facility] [address], [place] [phone], phone; [phone], fax Sees patients 1st and 3rd Saturday of every month.  Must not qualify for public or private insurance (i.e. Medicaid, Medicare, [place] Health Choice, Veterans' Benefits)  Household income should be no more than 200% of the poverty level The clinic cannot treat you if you are pregnant or think you are pregnant  Sexually transmitted diseases are not treated at the clinic.    Dental Care: Organization         Address  Phone  Notes  [facility] [address] [place], [place] [phone] Accepts children up to age 43 who are enrolled in [place] or [place] Health Choice; pregnant women with a Medicaid card; and children who have applied for Medicaid or [place] Health Choice, but were declined, whose parents can pay a reduced fee at time of service.  [facility]  [address], [place] [phone] Accepts children up to age 54 who are enrolled in [place] or [place] Health Choice; pregnant women with a Medicaid card; and children who have applied for Medicaid or [place] Health Choice, but were declined, whose parents can pay a reduced fee at time of service.  [facility]  [address] [place], [place] [phone] Patients are seen by appointment only. Walk-ins are not accepted. [facility] will see patients 56 years of age and older. Monday - Tuesday (8am-5pm) Most Wednesdays (8:30-5pm) $30 per visit,  cash only  [facility] Adult Dental Access PRO[facility]  [address], [facility] [phone] Patients are seen by appointment only. Walk-ins are not accepted. [facility] will see patients 35 years of age and older. One Wednesday Evening (Monthly: Volunteer Based).  $30 per visit, cash only  [employer]  [phone] for adults; Children under age 31, call Graduate Pediatric Dentistry at [phone]. Children aged 92-14, please call [phone] to request a pediatric application.  Dental services are provided in all areas of dental care including fillings, crowns and bridges, complete and partial dentures, implants, gum treatment, root canals, and extractions. Preventive care is also provided. Treatment is provided to both adults and children. Patients are selected via a lottery and there is often a waiting list.   [facility] [address], [place]  [phone] [email or web address]   [facility] [address] [place], [place] [phone], Ext. [phone] Second and Fourth Thursday of each month, opens at 6:30 AM; Clinic ends at 9 AM.  Patients are seen on a first-come first-served basis, and a limited number are seen during each clinic.   [facility]  [address] Ether Griffins [place], [place] [phone]   Eligibility Requirements You  must have lived in [place], [place], or [place] counties for at least the last three months.   You cannot be eligible for state or federal sponsored [place], including [employer], [place], or [employer].   You generally cannot be eligible for healthcare insurance through your employer.    How to apply: Eligibility screenings are held every Tuesday and Wednesday afternoon from 1:00 pm until 4:00 pm. You do not need an appointment for the interview!  [facility] [address], [place], [place] [phone]   [facility] Health Department   [phone]   [facility] Health Department  [phone]   [facility] Health Department  [phone]    Behavioral Health Resources in the Community: Intensive Outpatient Programs Organization         Address  Phone  Notes  [facility] [address], [place], [place] [phone]   [facility] Outpatient [address], [place], [place] [phone]   [facility] [address] [place], [place]  [phone]   [facility] [address]  [place], [place] [phone] or [phone]   Substance Abuse Resources Organization         Address  Phone  Notes  [facility]  [phone]   [facility]  [phone]   [facility] [place]  [phone]   Floydene Flock  [phone]   Residential & Outpatient Substance Abuse Program  [phone]   Psychological Services Organization         Address  Phone  Notes  [facility] Behavioral Health  [phone]   [facility] Services  [phone]   [facility] Mental Health [address], [place] [phone] or [phone]    Mobile Crisis Teams Organization         Address  Phone  Notes  [facility], Mobile Crisis Care Unit  [phone]   [facility]  [address] [place], [place] [phone]   Doristine Locks [address] [place] [phone]    Self-Help/Support Groups Organization         Address  Phone             Notes  [facility] [place] - variety of support groups  336- I7437963 Call for more information  Narcotics Anonymous (NA), [facility] [address], [employer] [place]  2 meetings at this location   Statistician         Address  Phone  Notes  [facility] [address],    [place]  [phone]   [facility]  [address] [place] [address], [place], [place] [phone]    [facility] [address] [place], [place] [phone] Admissions: 8am-3pm M-F  [facility] [address],    [place], [place] [phone]   [facility] [address] [place], [place], [place] [phone]   [facility] [address],  [place], [place] [phone]   [facility] - Intensive Outpatient [address] Laurell Josephs [address], [place], [place] [phone]   [facility] ([facility]iction [facility] [address] [place]  [place], [place] [phone] or [phone]   [facility] ([facility]) [address] [place], [place] [phone] Accepts Medicaid  [facility] [address],  [place] [phone] Substance Abuse/Addiction Treatment   [facility] Resources Organization         Address  Phone  Notes  [facility]  [phone]   Domenic Schwab, PhD [address] Arlis Port[place], [place]   [phone] or [phone]   [place], [place] [phone]   [address], [place], [place] [phone] Insurance/Medicaid/sponsorship through [facility] and Families [address] [place]                                    [place], [place] [phone] [place] [address], [place] [phone]    Dr. Adele Schilder  [phone]   [facility] Dept. 1) [address] [place] 2) [place] 3)  [address], [place] [phone]  [phone]   [place] [phone] or [phone] (After Hours)

## 2013-10-23 ENCOUNTER — Emergency Department (HOSPITAL_COMMUNITY): Payer: Self-pay

## 2013-10-23 ENCOUNTER — Emergency Department (HOSPITAL_COMMUNITY)
Admission: EM | Admit: 2013-10-23 | Discharge: 2013-10-23 | Disposition: A | Discharge status: Home / Self Care | Payer: Self-pay | Attending: Emergency Medicine | Admitting: Emergency Medicine

## 2013-10-23 ENCOUNTER — Encounter (HOSPITAL_COMMUNITY): Payer: Self-pay | Admitting: Emergency Medicine

## 2013-10-23 DIAGNOSIS — M549 Dorsalgia, unspecified: Secondary | ICD-10-CM | POA: Insufficient documentation

## 2013-10-23 DIAGNOSIS — E119 Type 2 diabetes mellitus without complications: Secondary | ICD-10-CM | POA: Insufficient documentation

## 2013-10-23 DIAGNOSIS — R1032 Left lower quadrant pain: Secondary | ICD-10-CM | POA: Insufficient documentation

## 2013-10-23 DIAGNOSIS — Z720 Tobacco use: Secondary | ICD-10-CM | POA: Insufficient documentation

## 2013-10-23 DIAGNOSIS — R1030 Lower abdominal pain, unspecified: Secondary | ICD-10-CM

## 2013-10-23 DIAGNOSIS — Z79899 Other long term (current) drug therapy: Secondary | ICD-10-CM | POA: Insufficient documentation

## 2013-10-23 DIAGNOSIS — R1031 Right lower quadrant pain: Secondary | ICD-10-CM | POA: Insufficient documentation

## 2013-10-23 LAB — COMPREHENSIVE METABOLIC PANEL
ALK PHOS: 76 U/L (ref 39–117)
ALT: 14 U/L (ref 0–53)
ANION GAP: 13 (ref 5–15)
AST: 14 U/L (ref 0–37)
Albumin: 3.9 g/dL (ref 3.5–5.2)
BUN: 7 mg/dL (ref 6–23)
CHLORIDE: 103 meq/L (ref 96–112)
CO2: 22 meq/L (ref 19–32)
CREATININE: 1.16 mg/dL (ref 0.50–1.35)
Calcium: 9.1 mg/dL (ref 8.4–10.5)
GFR calc Af Amer: >90 mL/min (ref >90)
GFR, EST NON AFRICAN AMERICAN: 80 mL/min — AB (ref >90)
GLUCOSE: 99 mg/dL (ref 70–99)
Potassium: 4.2 mEq/L (ref 3.7–5.3)
Sodium: 138 mEq/L (ref 137–147)
Total Protein: 7.3 g/dL (ref 6.0–8.3)

## 2013-10-23 LAB — URINALYSIS, ROUTINE W REFLEX MICROSCOPIC
BILIRUBIN URINE: NEGATIVE
GLUCOSE, UA: NEGATIVE mg/dL
HGB URINE DIPSTICK: NEGATIVE
KETONES UR: NEGATIVE mg/dL
Leukocytes, UA: NEGATIVE
Nitrite: NEGATIVE
PH: 6.5 (ref 5.0–8.0)
Protein, ur: NEGATIVE mg/dL
Specific Gravity, Urine: 1.003 — ABNORMAL LOW (ref 1.005–1.030)
Urobilinogen, UA: 0.2 mg/dL (ref 0.0–1.0)

## 2013-10-23 LAB — CBC WITH DIFFERENTIAL/PLATELET
BASOS ABS: 0 10*3/uL (ref 0.0–0.1)
Basophils Relative: 0 % (ref 0–1)
EOS PCT: 3 % (ref 0–5)
Eosinophils Absolute: 0.4 10*3/uL (ref 0.0–0.7)
HEMATOCRIT: 45.9 % (ref 39.0–52.0)
HEMOGLOBIN: 15.6 g/dL (ref 13.0–17.0)
LYMPHS ABS: 2.2 10*3/uL (ref 0.7–4.0)
LYMPHS PCT: 15 % (ref 12–46)
MCH: 31.7 pg (ref 26.0–34.0)
MCHC: 34 g/dL (ref 30.0–36.0)
MCV: 93.3 fL (ref 78.0–100.0)
MONO ABS: 1.2 10*3/uL — AB (ref 0.1–1.0)
MONOS PCT: 9 % (ref 3–12)
Neutro Abs: 10.4 10*3/uL — ABNORMAL HIGH (ref 1.7–7.7)
Neutrophils Relative %: 73 % (ref 43–77)
Platelets: 249 10*3/uL (ref 150–400)
RBC: 4.92 MIL/uL (ref 4.22–5.81)
RDW: 12.4 % (ref 11.5–15.5)
WBC: 14.2 10*3/uL — AB (ref 4.0–10.5)

## 2013-10-23 LAB — LIPASE, BLOOD: LIPASE: 20 U/L (ref 11–59)

## 2013-10-23 MED ORDER — OXYCODONE-ACETAMINOPHEN 5-325 MG PO TABS
1.0000 | ORAL_TABLET | Freq: Once | ORAL | Status: AC
  Administered 2013-10-23: 1 via ORAL
  Filled 2013-10-23: qty 1

## 2013-10-23 MED ORDER — IOHEXOL 300 MG/ML  SOLN
50.0000 mL | Freq: Once | INTRAMUSCULAR | Status: AC | PRN
  Administered 2013-10-23: 50 mL via ORAL

## 2013-10-23 MED ORDER — IOHEXOL 300 MG/ML  SOLN
100.0000 mL | Freq: Once | INTRAMUSCULAR | Status: AC | PRN
Start: 2013-10-23 — End: 2013-10-23
  Administered 2013-10-23: 100 mL via INTRAVENOUS

## 2013-10-23 MED ORDER — MORPHINE SULFATE 4 MG/ML IJ SOLN
4.0000 mg | Freq: Once | INTRAMUSCULAR | Status: AC
  Administered 2013-10-23: 4 mg via INTRAVENOUS
  Filled 2013-10-23: qty 1

## 2013-10-23 MED ORDER — SODIUM CHLORIDE 0.9 % IV BOLUS (SEPSIS)
1000.0000 mL | Freq: Once | INTRAVENOUS | Status: AC
  Administered 2013-10-23: 1000 mL via INTRAVENOUS

## 2013-10-23 MED ORDER — OXYCODONE-ACETAMINOPHEN 5-325 MG PO TABS: 1.0000 | ORAL_TABLET | Freq: Four times a day (QID) | ORAL | Status: AC | PRN

## 2013-10-23 MED ORDER — ONDANSETRON HCL 4 MG PO TABS: 4.0000 mg | ORAL_TABLET | Freq: Three times a day (TID) | ORAL | Status: AC | PRN

## 2013-10-23 NOTE — ED Notes (Signed)
PA at bedside.

## 2013-10-23 NOTE — ED Notes (Signed)
Initial Contact - pt A+Ox4, changed to hospital gown.  Pt reports LLQ abd pain rad to L flank.  Pt denies fevers/chills, denies dysuria, denies n/v/d/c.  9/10 pain.  Skin PWD.  MAEI, ambulatory with steady gait.  NAD.

## 2013-10-23 NOTE — ED Notes (Signed)
Patient transported to Ultrasound 

## 2013-10-23 NOTE — ED Provider Notes (Signed)
CSN: [id number]     Arrival date & time 10/23/13  1234 History   First MD Initiated Contact with Patient 10/23/13 1254     Chief Complaint  Patient presents with  . LLQ pain      (Consider location/radiation/quality/duration/timing/severity/associated sxs/prior Treatment) The history is provided by the patient. No language interpreter was used.  Vincent Cannon is a 33 y/o M with PMHx of DM presenting to the ED with abdominal pain that has been ongoing for the past 2 days. Patient reported that the pain is localized to the LLQ described as a throbbing pain that is constant, but worse with motion. Patient reported that when he moves there is shooting pain to the LLQ with radiation to the back. Stated that 2 days ago he was lifting furniture to help a friend move, denied injury or pain afterwards. Patient reported that he has been having nausea associated with the pain. Denied vomiting, diarrhea, melena, hematochezia, chest pain, shortness of breath, difficulty breathing, fall, injury, dysuria, penile complaints, groin pain, testicular complaints. PCP none  Past Medical History  Diagnosis Date  . Diabetes mellitus without complication    Past Surgical History  Procedure Laterality Date  . Mouth surgery     Family History  Problem Relation Age of Onset  . Diabetes Mother   . Hypertension Mother   . Hypertension Father   . Diabetes Father    History  Substance Use Topics  . Smoking status: Current Every Day Smoker -- 1.00 packs/day    Types: Cigarettes  . Smokeless tobacco: Never Used  . Alcohol Use: No    Review of Systems  Constitutional: Negative for fever and chills.  Respiratory: Negative for chest tightness and shortness of breath.   Cardiovascular: Negative for chest pain.  Gastrointestinal: Positive for abdominal pain. Negative for nausea, vomiting, diarrhea, constipation, blood in stool and anal bleeding.  Genitourinary: Negative for dysuria, urgency, hematuria,  decreased urine volume, discharge, penile swelling, scrotal swelling, penile pain and testicular pain.  Musculoskeletal: Positive for back pain. Negative for neck pain.  Neurological: Negative for weakness.      Allergies  Ibuprofen and Toradol  Home Medications   Prior to Admission medications   Medication Sig Start Date End Date Taking? Authorizing Provider  aspirin 81 MG tablet Take 162 mg by mouth every 6 (six) hours as needed for pain.   Yes Historical Provider, MD  haloperidol (HALDOL) 5 MG tablet Take 5 mg by mouth at bedtime.   Yes Historical Provider, MD  lithium carbonate 300 MG capsule Take 300 mg by mouth 2 (two) times daily with a meal.   Yes Historical Provider, MD  traZODone (DESYREL) 100 MG tablet Take 200 mg by mouth at bedtime.   Yes Historical Provider, MD   BP 138/85  Pulse 69  Temp(Src) 97.5 F (36.4 C) (Oral)  Resp 18  SpO2 99% Physical Exam  Nursing note and vitals reviewed. Constitutional: He is oriented to person, place, and time. He appears well-developed and well-nourished. No distress.  HENT:  Head: Normocephalic and atraumatic.  Mouth/Throat: Oropharynx is clear and moist. No oropharyngeal exudate.  Eyes: Conjunctivae and EOM are normal. Pupils are equal, round, and reactive to light. Right eye exhibits no discharge. Left eye exhibits no discharge.  Neck: Normal range of motion. Neck supple. No tracheal deviation present.  Cardiovascular: Normal rate, regular rhythm and normal heart sounds.  Exam reveals no friction rub.   No murmur heard. Pulmonary/Chest: Effort normal and breath sounds normal.  No respiratory distress. He has no wheezes. He has no rales.  Abdominal: Soft. Bowel sounds are normal. He exhibits no distension. There is tenderness in the right lower quadrant, suprapubic area and left lower quadrant. There is no rebound and no guarding.  Negative abdominal distension  BS normoactive in all 4 quadrants Abdomen soft upon palpation   Tenderness upon palpation to the LLQ, suprapubic and RLQ Negative peritoneal signs   Musculoskeletal: Normal range of motion.  Full ROM to upper and lower extremities without difficulty noted, negative ataxia noted.  Lymphadenopathy:    He has no cervical adenopathy.  Neurological: He is alert and oriented to person, place, and time. No cranial nerve deficit. He exhibits normal muscle tone. Coordination normal.  Skin: Skin is warm and dry. No rash noted. He is not diaphoretic. No erythema.  Psychiatric: He has a normal mood and affect. His behavior is normal. Thought content normal.    ED Course  Procedures (including critical care time)  Results for orders placed during the hospital encounter of 10/23/13  CBC WITH DIFFERENTIAL      Result Value Ref Range   WBC 14.2 (*) 4.0 - 10.5 K/uL   RBC 4.92  4.22 - 5.81 MIL/uL   Hemoglobin 15.6  13.0 - 17.0 g/dL   HCT 16.145.9  09.639.0 - 04.552.0 %   MCV 93.3  78.0 - 100.0 fL   MCH 31.7  26.0 - 34.0 pg   MCHC 34.0  30.0 - 36.0 g/dL   RDW 40.912.4  81.111.5 - 91.415.5 %   Platelets 249  150 - 400 K/uL   Neutrophils Relative % 73  43 - 77 %   Neutro Abs 10.4 (*) 1.7 - 7.7 K/uL   Lymphocytes Relative 15  12 - 46 %   Lymphs Abs 2.2  0.7 - 4.0 K/uL   Monocytes Relative 9  3 - 12 %   Monocytes Absolute 1.2 (*) 0.1 - 1.0 K/uL   Eosinophils Relative 3  0 - 5 %   Eosinophils Absolute 0.4  0.0 - 0.7 K/uL   Basophils Relative 0  0 - 1 %   Basophils Absolute 0.0  0.0 - 0.1 K/uL  URINALYSIS, ROUTINE W REFLEX MICROSCOPIC      Result Value Ref Range   Color, Urine YELLOW  YELLOW   APPearance CLEAR  CLEAR   Specific Gravity, Urine 1.003 (*) 1.005 - 1.030   pH 6.5  5.0 - 8.0   Glucose, UA NEGATIVE  NEGATIVE mg/dL   Hgb urine dipstick NEGATIVE  NEGATIVE   Bilirubin Urine NEGATIVE  NEGATIVE   Ketones, ur NEGATIVE  NEGATIVE mg/dL   Protein, ur NEGATIVE  NEGATIVE mg/dL   Urobilinogen, UA 0.2  0.0 - 1.0 mg/dL   Nitrite NEGATIVE  NEGATIVE   Leukocytes, UA NEGATIVE   NEGATIVE  COMPREHENSIVE METABOLIC PANEL      Result Value Ref Range   Sodium 138  137 - 147 mEq/L   Potassium 4.2  3.7 - 5.3 mEq/L   Chloride 103  96 - 112 mEq/L   CO2 22  19 - 32 mEq/L   Glucose, Bld 99  70 - 99 mg/dL   BUN 7  6 - 23 mg/dL   Creatinine, Ser 7.821.16  0.50 - 1.35 mg/dL   Calcium 9.1  8.4 - 95.610.5 mg/dL   Total Protein 7.3  6.0 - 8.3 g/dL   Albumin 3.9  3.5 - 5.2 g/dL   AST 14  0 - 37 U/L  ALT 14  0 - 53 U/L   Alkaline Phosphatase 76  39 - 117 U/L   Total Bilirubin <0.2 (*) 0.3 - 1.2 mg/dL   GFR calc non Af Amer 80 (*) >90 mL/min   GFR calc Af Amer >90  >90 mL/min   Anion gap 13  5 - 15  LIPASE, BLOOD      Result Value Ref Range   Lipase 20  11 - 59 U/L    Labs Review Labs Reviewed  CBC WITH DIFFERENTIAL - Abnormal; Notable for the following:    WBC 14.2 (*)    Neutro Abs 10.4 (*)    Monocytes Absolute 1.2 (*)    All other components within normal limits  URINALYSIS, ROUTINE W REFLEX MICROSCOPIC - Abnormal; Notable for the following:    Specific Gravity, Urine 1.003 (*)    All other components within normal limits  COMPREHENSIVE METABOLIC PANEL - Abnormal; Notable for the following:    Total Bilirubin <0.2 (*)    GFR calc non Af Amer 80 (*)    All other components within normal limits  LIPASE, BLOOD    Imaging Review    EKG Interpretation None      MDM   Final diagnoses:  None    Medications  sodium chloride 0.9 % bolus 1,000 mL (1,000 mLs Intravenous New Bag/Given 10/23/13 1426)  oxyCODONE-acetaminophen (PERCOCET/ROXICET) 5-325 MG per tablet 1 tablet (1 tablet Oral Given 10/23/13 1426)  iohexol (OMNIPAQUE) 300 MG/ML solution 100 mL (100 mLs Intravenous Contrast Given 10/23/13 1609)  iohexol (OMNIPAQUE) 300 MG/ML solution 50 mL (50 mLs Oral Contrast Given 10/23/13 1450)  morphine 4 MG/ML injection 4 mg (4 mg Intravenous Given 10/23/13 1657)    Filed Vitals:   10/23/13 1241 10/23/13 1543  BP: 129/75 138/85  Pulse: 109 69  Temp: 97.5 F  (36.4 C)   TempSrc: Oral   Resp: 18   SpO2: 100% 99%   CBC noted elevated white blood cell count of 14.2. Hemoglobin and hematocrit within normal limits. CMP unremarkable. Lipase negative elevation. Urinalysis negative for infection-low specific gravity identified with 1.003. Doubt UTI/pyelonpehritis. Doubt pancreatitis. Cannot rule out possible kidney stone.  Discomfort upon palpation to lower quadrants of the abdomen. CT abdomen and pelvis and renal ultrasound ordered to rule out possible stone versus inflammatory process. Patient placed on IV fluids and pain medications. Discussed case with Francee PiccoloJennifer Piepenbrink, PA-C. [facility] at change in shift.   Vincent MuttonMarissa Makenzie Vittorio, PA-C 10/23/13 1730

## 2013-10-23 NOTE — ED Provider Notes (Signed)
[facility] pending CT abd/pelv, renal ultrasound results for evaluation of left lower quadrant pain for the last two days.  Results for orders placed during the hospital encounter of 10/23/13  CBC WITH DIFFERENTIAL      Result Value Ref Range   WBC 14.2 (*) 4.0 - 10.5 K/uL   RBC 4.92  4.22 - 5.81 MIL/uL   Hemoglobin 15.6  13.0 - 17.0 g/dL   HCT 16.145.9  09.639.0 - 04.552.0 %   MCV 93.3  78.0 - 100.0 fL   MCH 31.7  26.0 - 34.0 pg   MCHC 34.0  30.0 - 36.0 g/dL   RDW 40.912.4  81.111.5 - 91.415.5 %   Platelets 249  150 - 400 K/uL   Neutrophils Relative % 73  43 - 77 %   Neutro Abs 10.4 (*) 1.7 - 7.7 K/uL   Lymphocytes Relative 15  12 - 46 %   Lymphs Abs 2.2  0.7 - 4.0 K/uL   Monocytes Relative 9  3 - 12 %   Monocytes Absolute 1.2 (*) 0.1 - 1.0 K/uL   Eosinophils Relative 3  0 - 5 %   Eosinophils Absolute 0.4  0.0 - 0.7 K/uL   Basophils Relative 0  0 - 1 %   Basophils Absolute 0.0  0.0 - 0.1 K/uL  URINALYSIS, ROUTINE W REFLEX MICROSCOPIC      Result Value Ref Range   Color, Urine YELLOW  YELLOW   APPearance CLEAR  CLEAR   Specific Gravity, Urine 1.003 (*) 1.005 - 1.030   pH 6.5  5.0 - 8.0   Glucose, UA NEGATIVE  NEGATIVE mg/dL   Hgb urine dipstick NEGATIVE  NEGATIVE   Bilirubin Urine NEGATIVE  NEGATIVE   Ketones, ur NEGATIVE  NEGATIVE mg/dL   Protein, ur NEGATIVE  NEGATIVE mg/dL   Urobilinogen, UA 0.2  0.0 - 1.0 mg/dL   Nitrite NEGATIVE  NEGATIVE   Leukocytes, UA NEGATIVE  NEGATIVE  COMPREHENSIVE METABOLIC PANEL      Result Value Ref Range   Sodium 138  137 - 147 mEq/L   Potassium 4.2  3.7 - 5.3 mEq/L   Chloride 103  96 - 112 mEq/L   CO2 22  19 - 32 mEq/L   Glucose, Bld 99  70 - 99 mg/dL   BUN 7  6 - 23 mg/dL   Creatinine, Ser 7.821.16  0.50 - 1.35 mg/dL   Calcium 9.1  8.4 - 95.610.5 mg/dL   Total Protein 7.3  6.0 - 8.3 g/dL   Albumin 3.9  3.5 - 5.2 g/dL   AST 14  0 - 37 U/L   ALT 14  0 - 53 U/L   Alkaline Phosphatase 76  39 - 117 U/L   Total Bilirubin <0.2 (*)  0.3 - 1.2 mg/dL   GFR calc non Af Amer 80 (*) >90 mL/min   GFR calc Af Amer >90  >90 mL/min   Anion gap 13  5 - 15  LIPASE, BLOOD      Result Value Ref Range   Lipase 20  11 - 59 U/L   Ct Abdomen Pelvis W Contrast  10/23/2013   CLINICAL DATA:  Left lower quadrant pain  EXAM: CT ABDOMEN AND PELVIS WITH CONTRAST  TECHNIQUE: Multidetector CT imaging of the abdomen and pelvis was performed using the standard protocol following bolus administration of intravenous contrast.  CONTRAST:  50mL OMNIPAQUE IOHEXOL 300 MG/ML SOLN, 100mL OMNIPAQUE IOHEXOL 300 MG/ML SOLN  COMPARISON:  05/06/2012  FINDINGS: The lung bases are clear.  The liver demonstrates no focal abnormality. There is no intrahepatic or extrahepatic biliary ductal dilatation. The gallbladder is normal. The spleen demonstrates no focal abnormality. The kidneys, adrenal glands and pancreas are normal. The bladder is unremarkable.  The stomach, duodenum, small intestine, and large intestine demonstrate no contrast extravasation or dilatation. There is a normal caliber appendix in the right lower quadrant without periappendiceal inflammatory changes.There is diverticulosis without evidence of diverticulitis. There is no pneumoperitoneum, pneumatosis, or portal venous gas. There is no abdominal or pelvic free fluid. There is no lymphadenopathy.  The abdominal aorta is normal in caliber.  There are no lytic or sclerotic osseous lesions.  IMPRESSION: 1. No acute abdominal or pelvic pathology.   Electronically Signed   By: Elige KoHetal  Patel   On: 10/23/2013 16:36   [place] [facility]  10/23/2013   CLINICAL DATA:  Left-sided abdominal and flank pain for 2 days, initial evaluation  EXAM: RENAL/URINARY TRACT ULTRASOUND COMPLETE  COMPARISON:  10/23/2013 CT scan  FINDINGS: Right Kidney:  Length: 10.9 cm. Echogenicity within normal limits. No mass or hydronephrosis visualized.  Left Kidney:  Length: 11.2 cm. Echogenicity within normal limits. No mass or hydronephrosis  visualized. Mild lobulation of the left kidney.  Bladder:  Appears normal for degree of bladder distention.  IMPRESSION: Normal renal ultrasound   Electronically Signed   By: Esperanza Heiraymond  Rubner M.D.   On: 10/23/2013 17:10   1. Lower abdominal pain     On reevaluation abdomen is soft, mildly tender in lower quadrants without guarding, rigidity or rebound. Discussed ultrasound and CT scan results with patient. Patient is able to tolerate PO intake in the ED without difficulty. Return precautions were discussed with patient. Patient is agreeable to plan. Patient is stable at time of discharge    Jeannetta EllisJennifer L Lana Flaim, PA-C 10/23/13 2246

## 2013-10-23 NOTE — ED Notes (Signed)
Per pt, states left lower quadrant pain for 2 days-no N/V-no dysuria

## 2013-10-23 NOTE — ED Notes (Signed)
CT called, will be coming for patient in about 15 minutes.

## 2013-10-23 NOTE — Discharge Instructions (Signed)
Please follow up with your primary care physician in 1-2 days. If you do not have one please call the [facility] number listed above. Please take pain medication and/or muscle relaxants as prescribed and as needed for pain. Please do not drive on narcotic pain medication or on muscle relaxants. Please read all discharge instructions and return precautions.  ° ° °Abdominal Pain °Many things can cause abdominal pain. Usually, abdominal pain is not caused by a disease and will improve without treatment. It can often be observed and treated at home. Your health care provider will do a physical exam and possibly order blood tests and X-rays to help determine the seriousness of your pain. However, in many cases, more time must pass before a clear cause of the pain can be found. Before that point, your health care provider may not know if you need more testing or further treatment. °HOME CARE INSTRUCTIONS  °Monitor your abdominal pain for any changes. The following actions may help to alleviate any discomfort you are experiencing: °· Only take over-the-counter or prescription medicines as directed by your health care provider. °· Do not take laxatives unless directed to do so by your health care provider. °· Try a clear liquid diet (broth, tea, or water) as directed by your health care provider. Slowly move to a bland diet as tolerated. °SEEK MEDICAL CARE IF: °· You have unexplained abdominal pain. °· You have abdominal pain associated with nausea or diarrhea. °· You have pain when you urinate or have a bowel movement. °· You experience abdominal pain that wakes you in the night. °· You have abdominal pain that is worsened or improved by eating food. °· You have abdominal pain that is worsened with eating fatty foods. °· You have a fever. °SEEK IMMEDIATE MEDICAL CARE IF:  °· Your pain does not go away within 2 hours. °· You keep throwing up (vomiting). °· Your pain is felt only in portions of the  abdomen, such as the right side or the left lower portion of the abdomen. °· You pass bloody or black tarry stools. °MAKE SURE YOU: °· Understand these instructions.   °· Will watch your condition.   °· Will get help right away if you are not doing well or get worse.   °Document Released: 10/03/2004 Document Revised: 12/29/2012 Document Reviewed: 09/02/2012 °[employer]® Patient Information ©2015 [employer]. This information is not intended to replace advice given to you by your health care provider. Make sure you discuss any questions you have with your health care provider. ° °

## 2013-10-24 NOTE — ED Provider Notes (Signed)
Medical screening examination/treatment/procedure(s) were performed by non-physician practitioner and as supervising physician I was immediately available for consultation/collaboration.   EKG Interpretation None        Gwyneth SproutWhitney Monea Pesantez, MD 10/24/13 1601

## 2013-10-24 NOTE — ED Provider Notes (Signed)
Medical screening examination/treatment/procedure(s) were performed by non-physician practitioner and as supervising physician I was immediately available for consultation/collaboration.   EKG Interpretation None        Toy CookeyMegan Docherty, MD 10/24/13 0022

## 2014-03-24 ENCOUNTER — Emergency Department (HOSPITAL_COMMUNITY)
Admission: EM | Admit: 2014-03-24 | Discharge: 2014-03-24 | Disposition: A | Discharge status: Home / Self Care | Payer: Self-pay | Attending: Emergency Medicine | Admitting: Emergency Medicine

## 2014-03-24 ENCOUNTER — Encounter (HOSPITAL_COMMUNITY): Payer: Self-pay | Admitting: Emergency Medicine

## 2014-03-24 DIAGNOSIS — Z720 Tobacco use: Secondary | ICD-10-CM | POA: Insufficient documentation

## 2014-03-24 DIAGNOSIS — I10 Essential (primary) hypertension: Secondary | ICD-10-CM | POA: Insufficient documentation

## 2014-03-24 DIAGNOSIS — K029 Dental caries, unspecified: Secondary | ICD-10-CM | POA: Insufficient documentation

## 2014-03-24 DIAGNOSIS — Z79899 Other long term (current) drug therapy: Secondary | ICD-10-CM | POA: Insufficient documentation

## 2014-03-24 DIAGNOSIS — Z7982 Long term (current) use of aspirin: Secondary | ICD-10-CM | POA: Insufficient documentation

## 2014-03-24 DIAGNOSIS — K006 Disturbances in tooth eruption: Secondary | ICD-10-CM | POA: Insufficient documentation

## 2014-03-24 DIAGNOSIS — H9192 Unspecified hearing loss, left ear: Secondary | ICD-10-CM | POA: Insufficient documentation

## 2014-03-24 DIAGNOSIS — K047 Periapical abscess without sinus: Secondary | ICD-10-CM | POA: Insufficient documentation

## 2014-03-24 DIAGNOSIS — E119 Type 2 diabetes mellitus without complications: Secondary | ICD-10-CM | POA: Insufficient documentation

## 2014-03-24 HISTORY — DX: Hypoglycemia, unspecified: E16.2

## 2014-03-24 HISTORY — DX: Essential (primary) hypertension: I10

## 2014-03-24 MED ORDER — DOXYCYCLINE HYCLATE 100 MG PO CAPS: 100.0000 mg | ORAL_CAPSULE | Freq: Two times a day (BID) | ORAL | Status: AC

## 2014-03-24 MED ORDER — HYDROCODONE-ACETAMINOPHEN 5-325 MG PO TABS: 1.0000 | ORAL_TABLET | Freq: Four times a day (QID) | ORAL | Status: DC | PRN

## 2014-03-24 MED ORDER — DOXYCYCLINE HYCLATE 100 MG PO CAPS: 100.0000 mg | ORAL_CAPSULE | Freq: Two times a day (BID) | ORAL | Status: DC

## 2014-03-24 MED ORDER — HYDROCODONE-ACETAMINOPHEN 5-325 MG PO TABS
1.0000 | ORAL_TABLET | Freq: Once | ORAL | Status: AC
  Administered 2014-03-24: 1 via ORAL
  Filled 2014-03-24: qty 1

## 2014-03-24 MED ORDER — HYDROCODONE-ACETAMINOPHEN 5-325 MG PO TABS: 1.0000 | ORAL_TABLET | Freq: Four times a day (QID) | ORAL | Status: AC | PRN

## 2014-03-24 NOTE — ED Notes (Signed)
Pt reports pain in l/upper mouth x 2 days. Pain in jaw causing pressure in l/ear. Treatment with OTC meds

## 2014-03-24 NOTE — Discharge Instructions (Signed)
Apply warm compresses to jaw throughout the day. Take antibiotic until finished, avoid sunlight. Take norco as directed, as needed for pain but do not drive or operate machinery with pain medication use. Followup with a dentist is very important for ongoing evaluation and management of recurrent dental pain. Call the dentist listed above in the next 24-48 hours, or use the list below to find a dentist. STOP SMOKING! Return to emergency department for emergent changing or worsening symptoms.    Dental Abscess A dental abscess is a collection of infected fluid (pus) from a bacterial infection in the inner part of the tooth (pulp). It usually occurs at the end of the tooth's root.  CAUSES   Severe tooth decay.  Trauma to the tooth that allows bacteria to enter into the pulp, such as a broken or chipped tooth. SYMPTOMS   Severe pain in and around the infected tooth.  Swelling and redness around the abscessed tooth or in the mouth or face.  Tenderness.  Pus drainage.  Bad breath.  Bitter taste in the mouth.  Difficulty swallowing.  Difficulty opening the mouth.  Nausea.  Vomiting.  Chills.  Swollen neck glands. DIAGNOSIS   A medical and dental history will be taken.  An examination will be performed by tapping on the abscessed tooth.  X-rays may be taken of the tooth to identify the abscess. TREATMENT The goal of treatment is to eliminate the infection. You may be prescribed antibiotic medicine to stop the infection from spreading. A root canal may be performed to save the tooth. If the tooth cannot be saved, it may be pulled (extracted) and the abscess may be drained.  HOME CARE INSTRUCTIONS  Only take over-the-counter or prescription medicines for pain, fever, or discomfort as directed by your caregiver.  Rinse your mouth (gargle) often with salt water ( tsp salt in 8 oz [250 ml] of warm water) to relieve pain or swelling.  Do not drive after taking pain medicine  (narcotics).  Do not apply heat to the outside of your face.  Return to your dentist for further treatment as directed. SEEK MEDICAL CARE IF:  Your pain is not helped by medicine.  Your pain is getting worse instead of better. SEEK IMMEDIATE MEDICAL CARE IF:  You have a fever or persistent symptoms for more than 2-3 days.  You have a fever and your symptoms suddenly get worse.  You have chills or a very bad headache.  You have problems breathing or swallowing.  You have trouble opening your mouth.  You have swelling in the neck or around the eye. Document Released: 12/24/2004 Document Revised: 09/18/2011 Document Reviewed: 04/03/2010 [facility] Patient Information 2015 [place], [place]. This information is not intended to replace advice given to you by your health care provider. Make sure you discuss any questions you have with your health care provider.  Dental Care and Dentist Visits Dental care supports good overall health. Regular dental visits can also help you avoid dental pain, bleeding, infection, and other more serious health problems in the future. It is important to keep the mouth healthy because diseases in the teeth, gums, and other oral tissues can spread to other areas of the body. Some problems, such as diabetes, heart disease, and pre-term labor have been associated with poor oral health.  See your dentist every 6 months. If you experience emergency problems such as a toothache or broken tooth, go to the dentist right away. If you see your dentist regularly, you may catch  problems early. It is easier to be treated for problems in the early stages.  WHAT TO EXPECT AT A DENTIST VISIT  Your dentist will look for many common oral health problems and recommend proper treatment. At your regular dental visit, you can expect:  Gentle cleaning of the teeth and gums. This includes scraping and polishing. This helps to remove the sticky substance around the teeth and gums (plaque).  Plaque forms in the mouth shortly after eating. Over time, plaque hardens on the teeth as tartar. If tartar is not removed regularly, it can cause problems. Cleaning also helps remove stains.  Periodic X-rays. These pictures of the teeth and supporting bone will help your dentist assess the health of your teeth.  Periodic fluoride treatments. Fluoride is a natural mineral shown to help strengthen teeth. Fluoride treatmentinvolves applying a fluoride gel or varnish to the teeth. It is most commonly done in children.  Examination of the mouth, tongue, jaws, teeth, and gums to look for any oral health problems, such as:  Cavities (dental caries). This is decay on the tooth caused by plaque, sugar, and acid in the mouth. It is best to catch a cavity when it is small.  Inflammation of the gums caused by plaque buildup (gingivitis).  Problems with the mouth or malformed or misaligned teeth.  Oral cancer or other diseases of the soft tissues or jaws. KEEP YOUR TEETH AND GUMS HEALTHY For healthy teeth and gums, follow these general guidelines as well as your dentist's specific advice:  Have your teeth professionally cleaned at the dentist every 6 months.  Brush twice daily with a fluoride toothpaste.  Floss your teeth daily.  Ask your dentist if you need fluoride supplements, treatments, or fluoride toothpaste.  Eat a healthy diet. Reduce foods and drinks with added sugar.  Avoid smoking. TREATMENT FOR ORAL HEALTH PROBLEMS If you have oral health problems, treatment varies depending on the conditions present in your teeth and gums.  Your caregiver will most likely recommend good oral hygiene at each visit.  For cavities, gingivitis, or other oral health disease, your caregiver will perform a procedure to treat the problem. This is typically done at a separate appointment. Sometimes your caregiver will refer you to another dental specialist for specific tooth problems or for  surgery. SEEK IMMEDIATE DENTAL CARE IF:  You have pain, bleeding, or soreness in the gum, tooth, jaw, or mouth area.  A permanent tooth becomes loose or separated from the gum socket.  You experience a blow or injury to the mouth or jaw area. Document Released: 09/05/2010 Document Revised: 03/18/2011 Document Reviewed: 09/05/2010 [facility] Patient Information 2015 [place], [place]. This information is not intended to replace advice given to you by your health care provider. Make sure you discuss any questions you have with your health care provider.  Emergency Department Resource Guide 1) Find a Doctor and Pay Out of Pocket Although you won't have to find out who is covered by your insurance plan, it is a good idea to ask around and get recommendations. You will then need to call the office and see if the doctor you have chosen will accept you as a new patient and what types of options they offer for patients who are self-pay. Some doctors offer discounts or will set up payment plans for their patients who do not have insurance, but you will need to ask so you aren't surprised when you get to your appointment.  2) Contact Your Local Health Department Not all  health departments have doctors that can see patients for sick visits, but many do, so it is worth a call to see if yours does. If you don't know where your local health department is, you can check in your phone book. The CDC also has a tool to help you locate your state's health department, and many state websites also have listings of all of their local health departments.  3) Find a Walk-in Clinic If your illness is not likely to be very severe or complicated, you may want to try a walk in clinic. These are popping up all over the country in pharmacies, drugstores, and shopping centers. They're usually staffed by nurse practitioners or physician assistants that have been trained to treat common illnesses and complaints. They're usually  fairly quick and inexpensive. However, if you have serious medical issues or chronic medical problems, these are probably not your best option.  No Primary Care Doctor: - Call [facility] Connect at  [phone] - they can help you locate a primary care doctor that  accepts your insurance, provides certain services, etc. - Physician Referral Service- [phone]  Chronic Pain Problems: Organization         Address  Phone   Notes  [facility]  [phone] Patients need to be referred by their primary care doctor.   Medication Assistance: Organization         Address  Phone   Notes  [facility] [address] [place], [place] [id number] [phone] --Must be a resident of [facility] [place] [facility] [place] -- Must have NO insurance coverage whatsoever (no Medicaid/ Medicare, etc.) -- The pt. MUST have a primary care doctor that directs their care regularly and follows them in the community   [facility]  [phone]   [employer]  [phone]     Dental Care: Organization         Address  Phone  Notes  [facility] [address], [place] [phone] Accepts children up to age 36 who are enrolled in [place] or [place] Health Choice; pregnant women with a Medicaid card; and children who have applied for Medicaid or [place] Health Choice, but were declined, whose parents can pay a reduced fee at time of service.  [facility] [place]  [address], [place] [phone] Accepts children up to age 36 who are enrolled in [place] or [place] Health Choice; pregnant women with a Medicaid card; and children who have applied for Medicaid or [place] Health Choice, but were declined, whose parents can pay a reduced fee at time of service.  [facility]  [address], [place] [phone] Patients are seen by appointment only.  Walk-ins are not accepted. [facility] will see patients 36 years of age and older. Monday - Tuesday (8am-5pm) Most Wednesdays (8:30-5pm) $30 per visit, cash only  [facility]  [address], [facility] [place] [phone] Patients are seen by appointment only. Walk-ins are not accepted. [facility] will see patients 36 years of age and older. One Wednesday Evening (Monthly: Volunteer Based).  $30 per visit, cash only  [facility]  [phone] for adults; Children under age 344, call Graduate Pediatric Dentistry at [phone]. Children aged 674-14, please call [phone] to request a pediatric application.  Dental services are provided in all  areas of dental care including fillings, crowns and bridges, complete and partial dentures, implants, gum treatment, root canals, and extractions. Preventive care is also provided. Treatment is provided to both adults and children. Patients are selected via a lottery and there is often a waiting list.   [facility] [address] [place]  [phone] [email or web address]   [facility] [address] [place], [place] [phone], Ext. [phone] Second and Fourth Thursday of each month, opens at 6:30 AM; Clinic ends at 9 AM.  Patients are seen on a first-come first-served basis, and a limited number are seen during each clinic.   [facility]  [address] [place], [place] [phone]   Eligibility Requirements You must have lived in [place], [place], or [place] counties for at least the last three months.   You cannot be eligible for state or federal sponsored [place], including [employer], [place], or [employer].   You generally cannot be eligible for healthcare insurance through your employer.    How to apply: Eligibility screenings are held every Tuesday and Wednesday afternoon from 1:00 pm until 4:00 pm. You do not need an  appointment for the interview!  [facility] [address] [place], [place] [phone]   [facility] Department  [phone]   [facility]  [phone]   [facility] Department  [phone]

## 2014-03-24 NOTE — ED Provider Notes (Signed)
CSN: [id number]     Arrival date & time 03/24/14  1122 History  This chart was scribed for non-physician practitioner, Allen DerryMercedes Camprubi-Soms, PA-C, working with Tilden FossaElizabeth Rees, MD, by Ronney LionSuzanne Le, ED Scribe. This patient was seen in room WTR9/WTR9 and the patient's care was started at 12:25 PM.    Chief Complaint  Patient presents with  . Dental Pain    pain in l/upper jaw  . Otalgia    Patient is a 34 y.o. male presenting with tooth pain. The history is provided by the patient. No language interpreter was used.  Dental Pain Location:  Upper Upper teeth location:  14/LU 1st molar Quality:  Throbbing Severity:  Severe Onset quality:  Gradual Duration:  2 days Timing:  Constant Progression:  Worsening Chronicity:  New Context: dental caries   Relieved by: pressure to left ear. Worsened by:  Pressure, hot food/drink and cold food/drink Ineffective treatments:  Acetaminophen Associated symptoms: no difficulty swallowing, no drooling, no facial swelling, no fever, no gum swelling, no neck pain, no neck swelling, no oral bleeding, no oral lesions and no trismus       HPI Comments: Vincent Cannon is a 34 y.o. male with a PMHx of DM2 and HTN, who presents to the Emergency Department complaining of constant, severe, 9/10, throbbing left upper dental pain radiating into his left ear that began 2 days ago. He complains of associated hearing loss in his left, with "muffled" sounds. Chewing exacerbates the pain. Patient has tried Tylenol and BC Goody's powder, which have been ineffective. He states that putting pressure on his left ear alleviates it. He denies fever, ear drainage, trouble swallowing, sore throat, facial swelling, gum drainage, trismus, chest pain, SOB, abdominal pain, nausea, or vomiting. Patient is a smoker. He has known allergies to ibuprofen and Toradol.  Past Medical History  Diagnosis Date  . Diabetes mellitus without complication   . Hypoglycemia   . Hypertension    Past  Surgical History  Procedure Laterality Date  . Mouth surgery     Family History  Problem Relation Age of Onset  . Diabetes Mother   . Hypertension Mother   . Hypertension Father   . Diabetes Father    History  Substance Use Topics  . Smoking status: Current Every Day Smoker -- 1.00 packs/day    Types: Cigarettes  . Smokeless tobacco: Never Used  . Alcohol Use: No    Review of Systems  Constitutional: Negative for fever and chills.  HENT: Positive for dental problem, ear pain and hearing loss (muffled in L ear). Negative for drooling, ear discharge, facial swelling, mouth sores, rhinorrhea, sore throat and trouble swallowing.   Respiratory: Negative for shortness of breath.   Cardiovascular: Negative for chest pain.  Gastrointestinal: Negative for nausea, vomiting, abdominal pain and diarrhea.  Musculoskeletal: Negative for neck pain and neck stiffness.  Skin: Negative for color change.  Neurological: Negative for weakness and numbness.   A complete 10 system review of systems was obtained and all systems are negative except as noted in the HPI and PMH.     Allergies  Ibuprofen and Toradol  Home Medications   Prior to Admission medications   Medication Sig Start Date End Date Taking? Authorizing Provider  acetaminophen (TYLENOL) 325 MG tablet Take 650 mg by mouth every 6 (six) hours as needed for mild pain.   Yes Historical Provider, MD  Aspirin-Acetaminophen-Caffeine (GOODY HEADACHE PO) Take 1 packet by mouth every 6 (six) hours as needed (headache.).  Yes Historical Provider, MD  haloperidol (HALDOL) 5 MG tablet Take 5 mg by mouth at bedtime.   Yes Historical Provider, MD  lithium carbonate 300 MG capsule Take 300 mg by mouth 2 (two) times daily with a meal.   Yes Historical Provider, MD  traZODone (DESYREL) 100 MG tablet Take 200 mg by mouth at bedtime.   Yes Historical Provider, MD  aspirin 81 MG tablet Take 162 mg by mouth every 6 (six) hours as needed for pain.     Historical Provider, MD  ondansetron (ZOFRAN) 4 MG tablet Take 1 tablet (4 mg total) by mouth every 8 (eight) hours as needed for nausea or vomiting. Patient not taking: Reported on 03/24/2014 10/23/13   Francee Piccolo, PA-C  oxyCODONE-acetaminophen (PERCOCET) 5-325 MG per tablet Take 1-2 tablets by mouth every 6 (six) hours as needed for severe pain. Patient not taking: Reported on 03/24/2014 10/23/13   Victorino Dike Piepenbrink, PA-C   BP 141/85 mmHg  Pulse 94  Temp(Src) 98.5 F (36.9 C) (Oral)  Resp 18  SpO2 100% Physical Exam  Constitutional: He is oriented to person, place, and time. Vital signs are normal. He appears well-developed and well-nourished.  Non-toxic appearance. No distress.  Afebrile, nontoxic, NAD  HENT:  Head: Normocephalic and atraumatic.  Right Ear: Hearing, tympanic membrane, external ear and ear canal normal.  Left Ear: Hearing, tympanic membrane, external ear and ear canal normal.  Nose: Nose normal. No rhinorrhea.  Mouth/Throat: Uvula is midline, oropharynx is clear and moist and mucous membranes are normal. No trismus in the jaw. Abnormal dentition. Dental abscesses (possible) and dental caries present. No uvula swelling.    TTP to L upper molar #14 with defect to lateral enamal, poor dentitia throughout, mild gingival swelling and erythema surrounding tooth #14, no trismus or drooling, oropharynx clear with MMM Ears clear bilaterally Nose clear No facial swelling  Eyes: Conjunctivae and EOM are normal. Right eye exhibits no discharge. Left eye exhibits no discharge.  Neck: Normal range of motion. Neck supple.  Cardiovascular: Normal rate.   Pulmonary/Chest: Effort normal. No respiratory distress.  Abdominal: Normal appearance. He exhibits no distension.  Musculoskeletal: Normal range of motion.  Lymphadenopathy:       Head (left side): Submandibular adenopathy present.  Mild L sided submandibular reactive LAD  Neurological: He is alert and oriented to  person, place, and time. He has normal strength. No sensory deficit.  Skin: Skin is warm, dry and intact. No rash noted.  Psychiatric: He has a normal mood and affect.  Nursing note and vitals reviewed.   ED Course  Procedures (including critical care time)  DIAGNOSTIC STUDIES: Oxygen Saturation is 100% on room air, normal by my interpretation.    COORDINATION OF CARE: 12:26 PM - Discussed treatment plan with pt at bedside which includes antibiotics, pain medication, and referral to follow up with dentist for tooth extraction, and pt agreed to plan. Strict return precautions given if patient notices worsening symptoms.    MDM   Final diagnoses:  Pain due to dental caries  Dental abscess   34 y.o. male here with dental pain. Dental pain associated with dental decay and possible dental infection with patient afebrile, non toxic appearing and swallowing secretions well. I gave patient referral to dentist and stressed the importance of dental follow up for ultimate management of dental pain.  I have also discussed reasons to return immediately to the ER.  Patient expresses understanding and agrees with plan.  I will also give doxycycline  and pain control.  Counseled on smoking cessation  I personally performed the services described in this documentation, which was scribed in my presence. The recorded information has been reviewed and is accurate.  BP 141/85 mmHg  Pulse 94  Temp(Src) 98.5 F (36.9 C) (Oral)  Resp 18  SpO2 100%  Meds ordered this encounter  Medications  . HYDROcodone-acetaminophen (NORCO/VICODIN) 5-325 MG per tablet 1 tablet    Sig:   . doxycycline (VIBRAMYCIN) 100 MG capsule    Sig: Take 1 capsule (100 mg total) by mouth 2 (two) times daily. One po bid x 7 days    Dispense:  14 capsule    Refill:  0    Order Specific Question:  Supervising Provider    Answer:  MILLER, BRIAN [[id number]]  . HYDROcodone-acetaminophen (NORCO) 5-325 MG per tablet    Sig: Take 1 tablet  by mouth every 6 (six) hours as needed for severe pain.    Dispense:  10 tablet    Refill:  0    Order Specific Question:  Supervising Provider    Answer:  Eber Hong [[id number]]       Whitten Andreoni Camprubi-Soms, PA-C 03/24/14 1259  Tilden Fossa, MD 03/24/14 1325

## 2014-04-26 ENCOUNTER — Emergency Department (HOSPITAL_COMMUNITY): Payer: Self-pay

## 2014-04-26 ENCOUNTER — Emergency Department (HOSPITAL_COMMUNITY)
Admission: EM | Admit: 2014-04-26 | Discharge: 2014-04-26 | Disposition: A | Discharge status: Home / Self Care | Payer: Self-pay | Attending: Emergency Medicine | Admitting: Emergency Medicine

## 2014-04-26 ENCOUNTER — Encounter (HOSPITAL_COMMUNITY): Payer: Self-pay

## 2014-04-26 DIAGNOSIS — W231XXA Caught, crushed, jammed, or pinched between stationary objects, initial encounter: Secondary | ICD-10-CM | POA: Insufficient documentation

## 2014-04-26 DIAGNOSIS — Y9289 Other specified places as the place of occurrence of the external cause: Secondary | ICD-10-CM | POA: Insufficient documentation

## 2014-04-26 DIAGNOSIS — S6000XA Contusion of unspecified finger without damage to nail, initial encounter: Secondary | ICD-10-CM

## 2014-04-26 DIAGNOSIS — Z79899 Other long term (current) drug therapy: Secondary | ICD-10-CM | POA: Insufficient documentation

## 2014-04-26 DIAGNOSIS — Y998 Other external cause status: Secondary | ICD-10-CM | POA: Insufficient documentation

## 2014-04-26 DIAGNOSIS — I10 Essential (primary) hypertension: Secondary | ICD-10-CM | POA: Insufficient documentation

## 2014-04-26 DIAGNOSIS — S60052A Contusion of left little finger without damage to nail, initial encounter: Secondary | ICD-10-CM | POA: Insufficient documentation

## 2014-04-26 DIAGNOSIS — Y9389 Activity, other specified: Secondary | ICD-10-CM | POA: Insufficient documentation

## 2014-04-26 DIAGNOSIS — Z7982 Long term (current) use of aspirin: Secondary | ICD-10-CM | POA: Insufficient documentation

## 2014-04-26 DIAGNOSIS — E119 Type 2 diabetes mellitus without complications: Secondary | ICD-10-CM | POA: Insufficient documentation

## 2014-04-26 DIAGNOSIS — Z720 Tobacco use: Secondary | ICD-10-CM | POA: Insufficient documentation

## 2014-04-26 NOTE — ED Notes (Signed)
Pt hit hand yesterday on object.  Pain to left index finger.  No laceration

## 2014-04-26 NOTE — Discharge Instructions (Signed)
1. Medications: tylenol as needed for pain, usual home medications 2. Treatment: rest, drink plenty of fluids, use splint for comfort 3. Follow Up: Please followup with your primary doctor in 3 days as needed for discussion of your diagnoses and further evaluation after today's visit; if you do not have a primary care doctor use the resource guide provided to find one;     Contusion A contusion is a deep bruise. Contusions are the result of an injury that caused bleeding under the skin. The contusion may turn blue, purple, or yellow. Minor injuries will give you a painless contusion, but more severe contusions may stay painful and swollen for a few weeks.  CAUSES  A contusion is usually caused by a blow, trauma, or direct force to an area of the body. SYMPTOMS   Swelling and redness of the injured area.  Bruising of the injured area.  Tenderness and soreness of the injured area.  Pain. DIAGNOSIS  The diagnosis can be made by taking a history and physical exam. An X-ray, CT scan, or MRI may be needed to determine if there were any associated injuries, such as fractures. TREATMENT  Specific treatment will depend on what area of the body was injured. In general, the best treatment for a contusion is resting, icing, elevating, and applying cold compresses to the injured area. Over-the-counter medicines may also be recommended for pain control. Ask your caregiver what the best treatment is for your contusion. HOME CARE INSTRUCTIONS   Put ice on the injured area.  Put ice in a plastic bag.  Place a towel between your skin and the bag.  Leave the ice on for 15-20 minutes, 3-4 times a day, or as directed by your health care provider.  Only take over-the-counter or prescription medicines for pain, discomfort, or fever as directed by your caregiver. Your caregiver may recommend avoiding anti-inflammatory medicines (aspirin, ibuprofen, and naproxen) for 48 hours because these medicines may  increase bruising.  Rest the injured area.  If possible, elevate the injured area to reduce swelling. SEEK IMMEDIATE MEDICAL CARE IF:   You have increased bruising or swelling.  You have pain that is getting worse.  Your swelling or pain is not relieved with medicines. MAKE SURE YOU:   Understand these instructions.  Will watch your condition.  Will get help right away if you are not doing well or get worse. Document Released: 10/03/2004 Document Revised: 12/29/2012 Document Reviewed: 10/29/2010 [facility] Patient Information 2015 [employer], [place]. This information is not intended to replace advice given to you by your health care provider. Make sure you discuss any questions you have with your health care provider.    Emergency Department Resource Guide 1) Find a Doctor and Pay Out of Pocket Although you won't have to find out who is covered by your insurance plan, it is a good idea to ask around and get recommendations. You will then need to call the office and see if the doctor you have chosen will accept you as a new patient and what types of options they offer for patients who are self-pay. Some doctors offer discounts or will set up payment plans for their patients who do not have insurance, but you will need to ask so you aren't surprised when you get to your appointment.  2) Contact Your Local Health Department Not all health departments have doctors that can see patients for sick visits, but many do, so it is worth a call to see if yours does. If  you don't know where your local health department is, you can check in your phone book. The CDC also has a tool to help you locate your state's health department, and many state websites also have listings of all of their local health departments.  3) Find a [facility] If your illness is not likely to be very severe or complicated, you may want to try a walk in clinic. These are popping up all over the country in pharmacies,  drugstores, and shopping centers. They're usually staffed by nurse practitioners or physician assistants that have been trained to treat common illnesses and complaints. They're usually fairly quick and inexpensive. However, if you have serious medical issues or chronic medical problems, these are probably not your best option.  No Primary Care Doctor: - Call Health Connect at  [phone] - they can help you locate a primary care doctor that  accepts your insurance, provides certain services, etc. - Physician Referral Service- [phone]  Chronic Pain Problems: Organization         Address  Phone   Notes  [facility]  [phone] Patients need to be referred by their primary care doctor.   Medication Assistance: Organization         Address  Phone   Notes  [facility] Medication [facility] [address] [place], [place] [address] [phone] --Must be a resident of [facility] -- Must have NO insurance coverage whatsoever (no Medicaid/ Medicare, etc.) -- The pt. MUST have a primary care doctor that directs their care regularly and follows them in the community   Med[facility]ist  [phone]   [employer]  [phone]    Agencies that provide inexpensive medical care: Organization         Address  Phone   Notes  [place]  [phone]   [facility]    [phone]   [facility] [place], [place] [address] [phone]   [place] [address] [place] [phone]   Planned Parenthood    [phone]   [facility]    [phone]   [place] [facility] [address], [place] Phone:  [phone], Fax:  [phone] Hours of Operation:  9 am - 6 pm, M-F.  Also accepts Medicaid/Medicare and self-pay.  [facility] for [place], [address], [place] Phone:  [phone], Fax: [phone]. Hours of Operation:  8:30 am - 5:30 pm, M-F.  Also accepts Medicaid and self-pay.  [facility] [place] [address], [place] Phone: [phone]   [place], [place], [place] [phone], Ext. [phone] Mondays & Thursdays: 7-9 AM.  First 15 patients are seen on a first come, first serve basis.    [place] Providers:  Organization         Address  Phone   Notes  [facility] [address], [place] [phone] Also accepts self-pay patients.  [place], [place]  [phone]   [place], [address], [place] [phone]   [place] [facility] [address] [place] [phone]   Lucianne Lei [address], [place]   [phone] Only accepts  [place] Access Medicaid patients after they have their name applied to their card.   Self-Pay (no insurance) in [facility]:  Organization         Address  Phone   Notes  Sickle Cell Patients, [facility] [place] [phone]   [facility] [place] [phone]   [facility] [place]  [place], [place], [place] [phone]   [facility]/Dr. Osei-Bonsu  [address], [place] or [address], [place] [phone] Phone number for both [place] and [place] locations is the same.  Urgent Medical and [facility] [address], [place] [phone]   [facility] [address] [place] or [address] [phone]   [facility] [address] [place] [phone], phone; [phone], fax Sees patients 1st and 3rd Saturday of every month.  Must not qualify for public  or private insurance (i.e. Medicaid, Medicare, [place] Health Choice, Veterans' Benefits)  Household income should be no more than 200% of the poverty level The clinic cannot treat you if you are pregnant or think you are pregnant  Sexually transmitted diseases are not treated at the clinic.    Dental Care: Organization         Address  Phone  Notes  [facility] of [facility] [place] [phone] Accepts children up to age 52 who are enrolled in [place] or [place]; pregnant women with a Medicaid card; and children who have applied for Medicaid or [place] Health Choice, but were declined, whose parents can pay a reduced fee at time of service.  [facility] Department of [facility]  [address], [place] [phone] Accepts children up to age 27 who are enrolled in [place] or [place]; pregnant women with a Medicaid card; and children who have applied for Medicaid or [place] Health Choice, but were declined, whose parents can pay a reduced fee at time of service.  [place] Adult Dental Access PROGRAM  [place] [phone] Patients are seen by appointment only. Walk-ins are not accepted. [place] will see patients 55 years of age and older. Monday - Tuesday (8am-5pm) Most Wednesdays (8:30-5pm) $30 per visit, cash only  [facility] Adult Dental Access PROGRAM  [address], [facility] [phone] Patients are seen by appointment only. Walk-ins are not accepted. [place] will see patients 50 years of age and older. One Wednesday Evening (Monthly: Volunteer Based).  $30 per visit, cash only  [place]  [phone] for adults; Children under age 52, call Graduate Pediatric Dentistry at [phone]. Children aged 74-14, please call [phone] to request a pediatric application.  Dental services are provided in all areas of  dental care including fillings, crowns and bridges, complete and partial dentures, implants, gum treatment, root canals, and extractions. Preventive care is also provided. Treatment is provided to both adults and children. Patients are selected via a lottery and there is often a waiting list.   [facility] [address], [place]  [phone] [email or web address], [place], [place] [phone], Ext. [phone] Second and Fourth Thursday of each month, opens at 6:30 AM; Clinic ends at  9 AM.  Patients are seen on a first-come first-served basis, and a limited number are seen during each clinic.   [facility]  [address] Hillard Danker [place], [place] [phone]   Eligibility Requirements You must have lived in [place], [place], or [place] counties for at least the last three months.   You cannot be eligible for state or federal sponsored [employer], including [employer], [place], or [employer].   You generally cannot be eligible for healthcare insurance through your employer.    How to apply: Eligibility screenings are held every Tuesday and Wednesday afternoon from 1:00 pm until 4:00 pm. You do not need an appointment for the interview!  [facility] [address], [place], [place]   [place]  [place] Department  [place]  [phone]    Behavioral Health Resources in the Community: Intensive Outpatient Programs Organization         Address  Phone  Notes  [place]. [address], [place], [place] [phone]   [facility] Outpatient [address] [place], [place]   ADS: Alcohol & Drug Svcs [address] [place], [place]   [place] [address],  [place], [place] or  [phone]   Substance Abuse Resources Organization         Address  Phone  Notes  Alcohol and Drug Services  [phone]   [place]  [phone]   [facility] [place]   Chinita Pester  [phone]   Residential & Outpatient Substance Abuse Program  [phone]   Psychological Services Organization         Address  Phone  Notes  [facility] [place]  [place]  [phone]   [place] [address], [place] or [phone]    Mobile Crisis Teams Organization         Address  Phone  Notes  [facility], Mobile Crisis Care Unit  [phone]   Assertive Psychotherapeutic Services  [address] [place], [place]   [place] [address], [place] [phone]    Self-Help/Support Groups Organization         Address  Phone             Notes  [place]. of [place] - variety of support groups  [place] Call for more information  Narcotics Anonymous (NA), [facility] [address], [employer] [place]  2 meetings at this location   Special educational needs teacher         Address  Phone  Notes  ASAP Residential Treatment [place],    [place]  [phone]   [facility]  [address] [place] [id number], [place], [place]   [place], [place] [phone] Admissions: 8am-3pm M-F  Incentives Substance [place] [address]    [place], [place] [phone]   [facility] [address] Jadene Pierini [place], [place]   [facility] [address]  [place], [place]   [facility] Programs - Intensive Outpatient [address], [place], [place]   [facility] [address] Madelaine Bhat  [place], [place] or [phone]   Residential  Treatment  Services (RTS) [address] [place], [place] Accepts Medicaid  [facility] [place] [address],  [place] [phone] Substance Abuse/Addiction Treatment   [facility] Organization         Address  Phone  Notes  [facility]  [phone]   Domenic Schwab, PhD [address] Arlis Porta [place], [place]   [phone] or [phone]   [place], [place] [phone]   [address], [place], [place] [phone] Insurance/Medicaid/sponsorship through [facility] and Families [address] [place]                                    [place], [place] [phone] [place] [address], [place] [phone]    Dr. Adele Schilder  [phone]   [facility] Dept. 1) [address] [place] 2) [place] 3)  [place] [address], [place] [phone]  [phone]   [place] [phone] or [phone] (After Hours)

## 2014-04-26 NOTE — ED Provider Notes (Signed)
CSN: [id number]     Arrival date & time 04/26/14  1225 History  This chart is scribed for non-physician practitioner, Dierdre Forth, PA-C, working with Toy Cookey, MD by Abel Presto, ED Scribe.  This patient was seen in room WTR8/WTR8 and the patient's care was started 1:56 PM.      Chief Complaint  Patient presents with  . Finger Injury     The history is provided by the patient and medical records. No language interpreter was used.   HPI Comments: Vincent Cannon is a 34 y.o. male with PMHx of hypoglycemia and HTN who presents to the Emergency Department complaining of injury to left index finger with onset yesterday. Pt states he slammed finger in refrigerator door yesterday. Pt reports associated swelling, bruising, and pain. Pt has tried icing with no relief, states "it burns" when iced. Pt is allergic to ibuprofen and Toradol. No laceration noted. Pt is left handed. Pt denies fever, numbness, and any other injury.   Past Medical History  Diagnosis Date  . Diabetes mellitus without complication   . Hypoglycemia   . Hypertension    Past Surgical History  Procedure Laterality Date  . Mouth surgery     Family History  Problem Relation Age of Onset  . Diabetes Mother   . Hypertension Mother   . Hypertension Father   . Diabetes Father    History  Substance Use Topics  . Smoking status: Current Every Day Smoker -- 1.00 packs/day    Types: Cigarettes  . Smokeless tobacco: Never Used  . Alcohol Use: No    Review of Systems  Constitutional: Negative for fever and chills.  Eyes: Negative for visual disturbance.  Respiratory: Negative for shortness of breath.   Cardiovascular: Negative for chest pain.  Gastrointestinal: Negative for nausea and vomiting.  Musculoskeletal: Positive for myalgias, joint swelling and arthralgias. Negative for back pain, neck pain and neck stiffness.  Skin: Positive for color change (bruising). Negative for wound.  Neurological:  Negative for numbness and headaches.  Hematological: Does not bruise/bleed easily.  Psychiatric/Behavioral: The patient is not nervous/anxious.   All other systems reviewed and are negative.     Allergies  Ibuprofen and Toradol  Home Medications   Prior to Admission medications   Medication Sig Start Date End Date Taking? Authorizing Provider  acetaminophen (TYLENOL) 325 MG tablet Take 650 mg by mouth every 6 (six) hours as needed for mild pain.   Yes Historical Provider, MD  diphenhydrAMINE (BENADRYL) 25 MG tablet Take 25 mg by mouth every 6 (six) hours as needed for itching, allergies or sleep.   Yes Historical Provider, MD  aspirin 81 MG tablet Take 162 mg by mouth every 6 (six) hours as needed for pain.    Historical Provider, MD  Aspirin-Acetaminophen-Caffeine (GOODY HEADACHE PO) Take 1 packet by mouth every 6 (six) hours as needed (headache.).    Historical Provider, MD  doxycycline (VIBRAMYCIN) 100 MG capsule Take 1 capsule (100 mg total) by mouth 2 (two) times daily. One po bid x 7 days Patient not taking: Reported on 04/26/2014 03/24/14   Mercedes Camprubi-Soms, PA-C  haloperidol (HALDOL) 5 MG tablet Take 5 mg by mouth at bedtime.    Historical Provider, MD  HYDROcodone-acetaminophen (NORCO) 5-325 MG per tablet Take 1 tablet by mouth every 6 (six) hours as needed for severe pain. Patient not taking: Reported on 04/26/2014 03/24/14   Mercedes Camprubi-Soms, PA-C  lithium carbonate 300 MG capsule Take 300 mg by mouth 2 (two) times  daily with a meal.    Historical Provider, MD  ondansetron (ZOFRAN) 4 MG tablet Take 1 tablet (4 mg total) by mouth every 8 (eight) hours as needed for nausea or vomiting. Patient not taking: Reported on 03/24/2014 10/23/13   Francee PiccoloJennifer Piepenbrink, PA-C  oxyCODONE-acetaminophen (PERCOCET) 5-325 MG per tablet Take 1-2 tablets by mouth every 6 (six) hours as needed for severe pain. Patient not taking: Reported on 03/24/2014 10/23/13   Francee PiccoloJennifer Piepenbrink, PA-C   traZODone (DESYREL) 100 MG tablet Take 200 mg by mouth at bedtime.    Historical Provider, MD   BP 147/101 mmHg  Pulse 119  Temp(Src) 98.3 F (36.8 C) (Oral)  Resp 16  SpO2 99% Physical Exam  Constitutional: He appears well-developed and well-nourished. No distress.  HENT:  Head: Normocephalic and atraumatic.  Eyes: Conjunctivae are normal.  Neck: Normal range of motion.  Cardiovascular: Normal rate, regular rhythm and intact distal pulses.   Capillary refill < 3 sec  Pulmonary/Chest: Effort normal and breath sounds normal.  Musculoskeletal: He exhibits tenderness. He exhibits no edema.  ROM: Full range of motion of the DIP, PIP and MCP with pain the little finger of the left hand Full area of ecchymosis over the MCP with tenderness to palpation  Neurological: He is alert. Coordination normal.  Sensation intact Strength 5/5 including strong grip strength in the left upper extremity  Skin: Skin is warm and dry. He is not diaphoretic.  No tenting of the skin  Psychiatric: He has a normal mood and affect.  Nursing note and vitals reviewed.   ED Course  Procedures (including critical care time) DIAGNOSTIC STUDIES: Oxygen Saturation is 99% on room air, normal by my interpretation.    COORDINATION OF CARE: 1:59 PM Discussed treatment plan with patient at beside, the patient agrees with the plan and has no further questions at this time.   Labs Review Labs Reviewed - No data to display  Imaging Review Dg Hand Complete Left  04/26/2014   CLINICAL DATA:  Small finger pain. Crush injury 1 day ago. Initial encounter.  EXAM: LEFT HAND - COMPLETE 3+ VIEW  COMPARISON:  None.  FINDINGS: There is no evidence of fracture or dislocation. There is no evidence of arthropathy or other focal bone abnormality. Soft tissues are unremarkable.  IMPRESSION: Negative.   Electronically Signed   By: Andreas NewportGeoffrey  Lamke M.D.   On: 04/26/2014 13:32     EKG Interpretation None      MDM   Final  diagnoses:  Contusion, finger, initial encounter    Vincent Cannon presents with left little finger pain.  Patient X-Ray negative for obvious fracture or dislocation. Pain managed in ED. Pt advised to follow up with PCP if symptoms persist. Patient given brace while in ED, conservative therapy recommended and discussed. Patient will be dc home & is agreeable with above plan.  Patient noted to be hypertensive in the emergency department.  No signs of hypertensive urgency.  Discussed with patient the need for close follow-up and management by their primary care physician.  Pt with tachycardia at triage, but no tachycardia on physical exam.    BP 147/101 mmHg  Pulse 119  Temp(Src) 98.3 F (36.8 C) (Oral)  Resp 16  SpO2 99%  I personally performed the services described in this documentation, which was scribed in my presence. The recorded information has been reviewed and is accurate.   Dahlia ClientHannah Sydna Brodowski, PA-C 04/26/14 1640  Toy CookeyMegan Docherty, MD 04/26/14 2119

## 2014-09-19 ENCOUNTER — Emergency Department (HOSPITAL_COMMUNITY)
Admission: EM | Admit: 2014-09-19 | Discharge: 2014-09-19 | Disposition: A | Discharge status: Home / Self Care | Payer: Self-pay | Attending: Emergency Medicine | Admitting: Emergency Medicine

## 2014-09-19 ENCOUNTER — Emergency Department (HOSPITAL_COMMUNITY): Payer: Self-pay

## 2014-09-19 ENCOUNTER — Encounter (HOSPITAL_COMMUNITY): Payer: Self-pay

## 2014-09-19 DIAGNOSIS — Z7982 Long term (current) use of aspirin: Secondary | ICD-10-CM | POA: Insufficient documentation

## 2014-09-19 DIAGNOSIS — E11649 Type 2 diabetes mellitus with hypoglycemia without coma: Secondary | ICD-10-CM | POA: Insufficient documentation

## 2014-09-19 DIAGNOSIS — Z79899 Other long term (current) drug therapy: Secondary | ICD-10-CM | POA: Insufficient documentation

## 2014-09-19 DIAGNOSIS — M546 Pain in thoracic spine: Secondary | ICD-10-CM | POA: Insufficient documentation

## 2014-09-19 DIAGNOSIS — Z720 Tobacco use: Secondary | ICD-10-CM | POA: Insufficient documentation

## 2014-09-19 DIAGNOSIS — I10 Essential (primary) hypertension: Secondary | ICD-10-CM | POA: Insufficient documentation

## 2014-09-19 MED ORDER — CYCLOBENZAPRINE HCL 10 MG PO TABS: 10.0000 mg | ORAL_TABLET | Freq: Two times a day (BID) | ORAL | Status: AC | PRN

## 2014-09-19 MED ORDER — ACETAMINOPHEN 325 MG PO TABS
650.0000 mg | ORAL_TABLET | Freq: Once | ORAL | Status: AC
  Administered 2014-09-19: 650 mg via ORAL
  Filled 2014-09-19: qty 2

## 2014-09-19 MED ORDER — CYCLOBENZAPRINE HCL 10 MG PO TABS
5.0000 mg | ORAL_TABLET | Freq: Once | ORAL | Status: AC
  Administered 2014-09-19: 5 mg via ORAL
  Filled 2014-09-19: qty 1

## 2014-09-19 NOTE — ED Provider Notes (Signed)
CSN: [id number]     Arrival date & time 09/19/14  1253 History  This chart was scribed for non-physician practitioner Eyvonne Mechanic, PA-C working with Raeford Razor, MD by Murriel Hopper, ED Scribe. This patient was seen in room WTR6/WTR6 and the patient's care was started at 2:55 PM.    Chief Complaint  Patient presents with  . Back Pain     The history is provided by the patient. No language interpreter was used.    HPI Comments: Vincent Cannon is a 34 y.o. male who presents to the Emergency Department complaining of constant, worsening bilateral middle and lower back pain that has been present since yesterday morning. 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 Pt states that the old man had his arm around him at the time and then proceeded to fall on top of him, causing him to have back pain and discomfort. Pt states he has been taking Tylenol, using a heating pad, and a massage chair with little relief. Pt denies loss of sensation or function in lower extremities, or loss of bowels or bladder function.    Past Medical History  Diagnosis Date  . Diabetes mellitus without complication   . Hypoglycemia   . Hypertension    Past Surgical History  Procedure Laterality Date  . Mouth surgery     Family History  Problem Relation Age of Onset  . Diabetes Mother   . Hypertension Mother   . Hypertension Father   . Diabetes Father    Social History  Substance Use Topics  . Smoking status: Current Every Day Smoker -- 1.00 packs/day    Types: Cigarettes  . Smokeless tobacco: Never Used  . Alcohol Use: No    Review of Systems  All other systems reviewed and are negative.     Allergies  Ibuprofen and Toradol  Home Medications   Prior to Admission medications   Medication Sig Start Date End Date Taking? Authorizing Provider  acetaminophen (TYLENOL) 325 MG tablet Take 650 mg by mouth every 6 (six) hours as  needed for mild pain.    Historical Provider, MD  aspirin 81 MG tablet Take 162 mg by mouth every 6 (six) hours as needed for pain.    Historical Provider, MD  Aspirin-Acetaminophen-Caffeine (GOODY HEADACHE PO) Take 1 packet by mouth every 6 (six) hours as needed (headache.).    Historical Provider, MD  cyclobenzaprine (FLEXERIL) 10 MG tablet Take 1 tablet (10 mg total) by mouth 2 (two) times daily as needed for muscle spasms. 09/19/14   Eyvonne Mechanic, PA-C  diphenhydrAMINE (BENADRYL) 25 MG tablet Take 25 mg by mouth every 6 (six) hours as needed for itching, allergies or sleep.    Historical Provider, MD  doxycycline (VIBRAMYCIN) 100 MG capsule Take 1 capsule (100 mg total) by mouth 2 (two) times daily. One po bid x 7 days Patient not taking: Reported on 04/26/2014 03/24/14   Mercedes Camprubi-Soms, PA-C  haloperidol (HALDOL) 5 MG tablet Take 5 mg by mouth at bedtime.    Historical Provider, MD  HYDROcodone-acetaminophen (NORCO) 5-325 MG per tablet Take 1 tablet by mouth every 6 (six) hours as needed for severe pain. Patient not taking: Reported on 04/26/2014 03/24/14   Mercedes Camprubi-Soms, PA-C  lithium carbonate 300 MG capsule Take 300 mg by mouth 2 (two) times daily with a meal.    Historical Provider, MD  ondansetron (ZOFRAN) 4 MG tablet  Take 1 tablet (4 mg total) by mouth every 8 (eight) hours as needed for nausea or vomiting. Patient not taking: Reported on 03/24/2014 10/23/13   Francee Piccolo, PA-C  oxyCODONE-acetaminophen (PERCOCET) 5-325 MG per tablet Take 1-2 tablets by mouth every 6 (six) hours as needed for severe pain. Patient not taking: Reported on 03/24/2014 10/23/13   Francee Piccolo, PA-C  traZODone (DESYREL) 100 MG tablet Take 200 mg by mouth at bedtime.    Historical Provider, MD   BP 123/85 mmHg  Pulse 100  Temp(Src) 97.6 F (36.4 C) (Oral)  Resp 16  SpO2 100% Physical Exam  Constitutional: He is oriented to person, place, and time. He appears well-developed and  well-nourished. No distress.  HENT:  Head: Normocephalic.  Neck: Normal range of motion. Neck supple.  Pulmonary/Chest: Effort normal.  Musculoskeletal: Normal range of motion. He exhibits tenderness. He exhibits no edema.  No C, T, or L spine tenderness to palpation. No obvious signs of trauma, deformity, infection, step-offs. Lung expansion normal. No scoliosis or kyphosis. Bilateral lower extremity strength 5 out of 5, sensation grossly intact, patellar reflexes 2+, pedal pulses 2+, Refill less than 3 seconds.  Straight leg negative Ambulates without difficulty  Mild tenderness to thoracic and lumbar soft tissue   Neurological: He is alert and oriented to person, place, and time.  Skin: Skin is warm and dry. He is not diaphoretic.  Psychiatric: He has a normal mood and affect. His behavior is normal. Judgment and thought content normal.  Nursing note and vitals reviewed.   ED Course  Procedures (including critical care time)  DIAGNOSTIC STUDIES: Oxygen Saturation is 100% on room air, normal by my interpretation.    COORDINATION OF CARE: 3:02 PM Discussed treatment plan with pt at bedside and pt agreed to plan.   Labs Review Labs Reviewed - No data to display  Imaging Review No results found. I have personally reviewed and evaluated these images and lab results as part of my medical decision-making.   EKG Interpretation None      MDM   Final diagnoses:  Bilateral thoracic back pain  Labs:  Imaging: DG thoracic, DG lumbar- no significant findings  Consults:  Therapeutics:  Discharge Meds: Flexeril  Assessment/Plan: Patient presents with back pain, no red flags, no significant findings on diagnostic imaging. Symptomatic care instructions given, follow-up with primary care if symptoms persist, follow-up sooner as needed.   I personally performed the services described in this documentation, which was scribed in my presence. The recorded information has been  reviewed and is accurate.    Eyvonne Mechanic, PA-C 09/23/14 2037  Raeford Razor, MD 09/29/14 [id number]

## 2014-09-19 NOTE — Discharge Instructions (Signed)
Back Pain, Adult °Low back pain is very common. About 1 in 5 people have back pain. The cause of low back pain is rarely dangerous. The pain often gets better over time. About half of people with a sudden onset of back pain feel better in just 2 weeks. About 8 in 10 people feel better by 6 weeks.  °CAUSES °Some common causes of back pain include: °· Strain of the muscles or ligaments supporting the spine. °· Wear and tear (degeneration) of the spinal discs. °· Arthritis. °· Direct injury to the back. °DIAGNOSIS °Most of the time, the direct cause of low back pain is not known. However, back pain can be treated effectively even when the exact cause of the pain is unknown. Answering your caregiver's questions about your overall health and symptoms is one of the most accurate ways to make sure the cause of your pain is not dangerous. If your caregiver needs more information, he or she may order lab work or imaging tests (X-rays or MRIs). However, even if imaging tests show changes in your back, this usually does not require surgery. °HOME CARE INSTRUCTIONS °For many people, back pain returns. Since low back pain is rarely dangerous, it is often a condition that people can learn to manage on their own.  °· Remain active. It is stressful on the back to sit or stand in one place. Do not sit, drive, or stand in one place for more than 30 minutes at a time. Take short walks on level surfaces as soon as pain allows. Try to increase the length of time you walk each day. °· Do not stay in bed. Resting more than 1 or 2 days can delay your recovery. °· Do not avoid exercise or work. Your body is made to move. It is not dangerous to be active, even though your back may hurt. Your back will likely heal faster if you return to being active before your pain is gone. °· Pay attention to your body when you  bend and lift. Many people have less discomfort when lifting if they bend their knees, keep the load close to their bodies, and  avoid twisting. Often, the most comfortable positions are those that put less stress on your recovering back. °· Find a comfortable position to sleep. Use a firm mattress and lie on your side with your knees slightly bent. If you lie on your back, put a pillow under your knees. °· Only take over-the-counter or prescription medicines as directed by your caregiver. Over-the-counter medicines to reduce pain and inflammation are often the most helpful. Your caregiver may prescribe muscle relaxant drugs. These medicines help dull your pain so you can more quickly return to your normal activities and healthy exercise. °· Put ice on the injured area. °¨ Put ice in a plastic bag. °¨ Place a towel between your skin and the bag. °¨ Leave the ice on for 15-20 minutes, 03-04 times a day for the first 2 to 3 days. After that, ice and heat may be alternated to reduce pain and spasms. °· Ask your caregiver about trying back exercises and gentle massage. This may be of some benefit. °· Avoid feeling anxious or stressed. Stress increases muscle tension and can worsen back pain. It is important to recognize when you are anxious or stressed and learn ways to manage it. Exercise is a great option. °SEEK MEDICAL CARE IF: °· You have pain that is not relieved with rest or medicine. °· You have pain that does not improve in 1 week. °· You have new symptoms. °· You are generally not feeling well. °SEEK   IMMEDIATE MEDICAL CARE IF:   You have pain that radiates from your back into your legs.  You develop new bowel or bladder control problems.  You have unusual weakness or numbness in your arms or legs.  You develop nausea or vomiting.  You develop abdominal pain.  You feel faint. Document Released: 12/24/2004 Document Revised: 06/25/2011 Document Reviewed: 04/27/2013 [facility] Patient Information 2015 [place], [place]. This information is not intended to replace advice given to you by your health care provider. Make sure you  discuss any questions you have with your health care provider.  Please use medication as directed, please follow-up with primary care provider for further evaluation and management. Please read attached information.

## 2014-09-19 NOTE — ED Notes (Signed)
Patient transported to X-ray 

## 2014-09-19 NOTE — ED Notes (Signed)
Pt works in home as a Engineer, structural.  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  Back pain is in mid back.  Tylenol and heat pad not helping

## 2016-06-17 IMAGING — CR DG LUMBAR SPINE COMPLETE 4+V
5 series · 5 of 5 positions shown · non-contrast
Comparison: CT scan from 0425

CLINICAL DATA: Injured back yesterday.  Persistent pain

EXAM:
THORACIC SPINE 2 VIEWS ; LUMBAR SPINE - COMPLETE 4+ VIEW

[t lumbar spine ap]
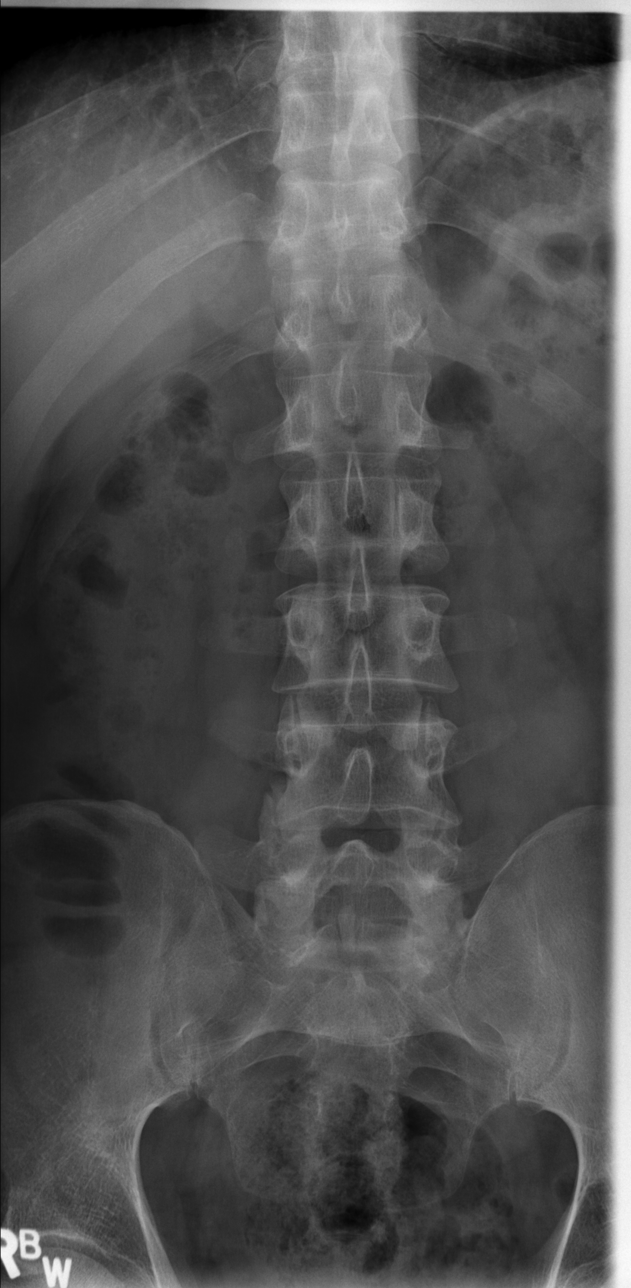

[t lumbar spine obl (1 of 2)]
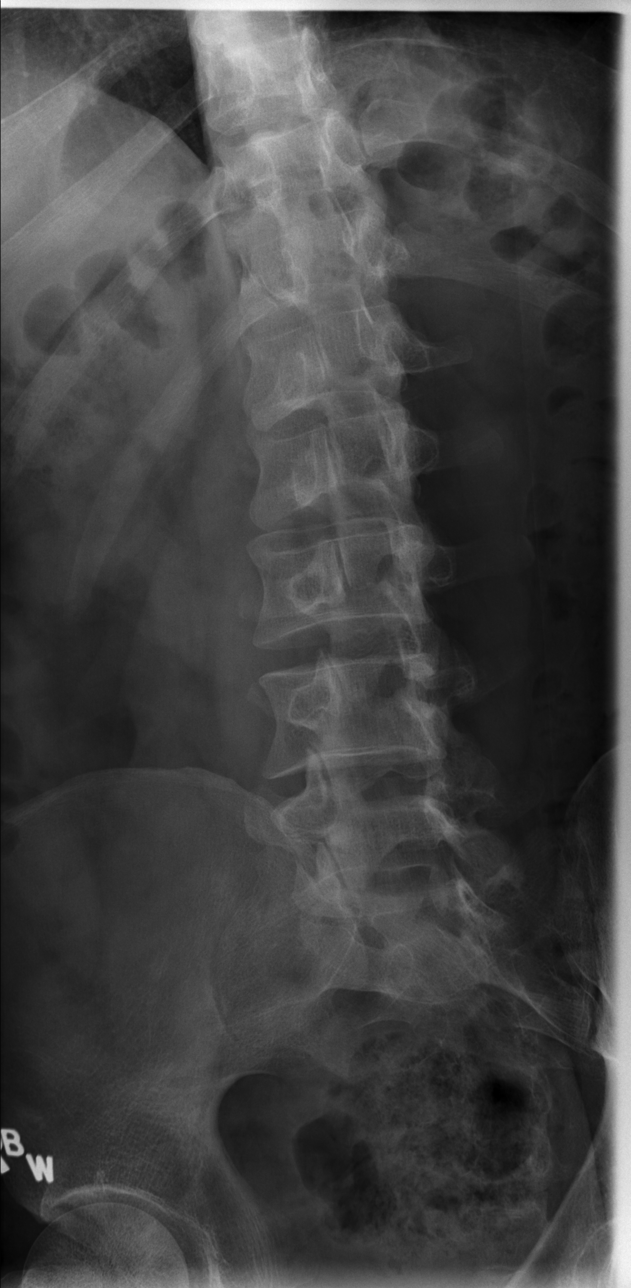

[t lumbar spine obl (2 of 2)]
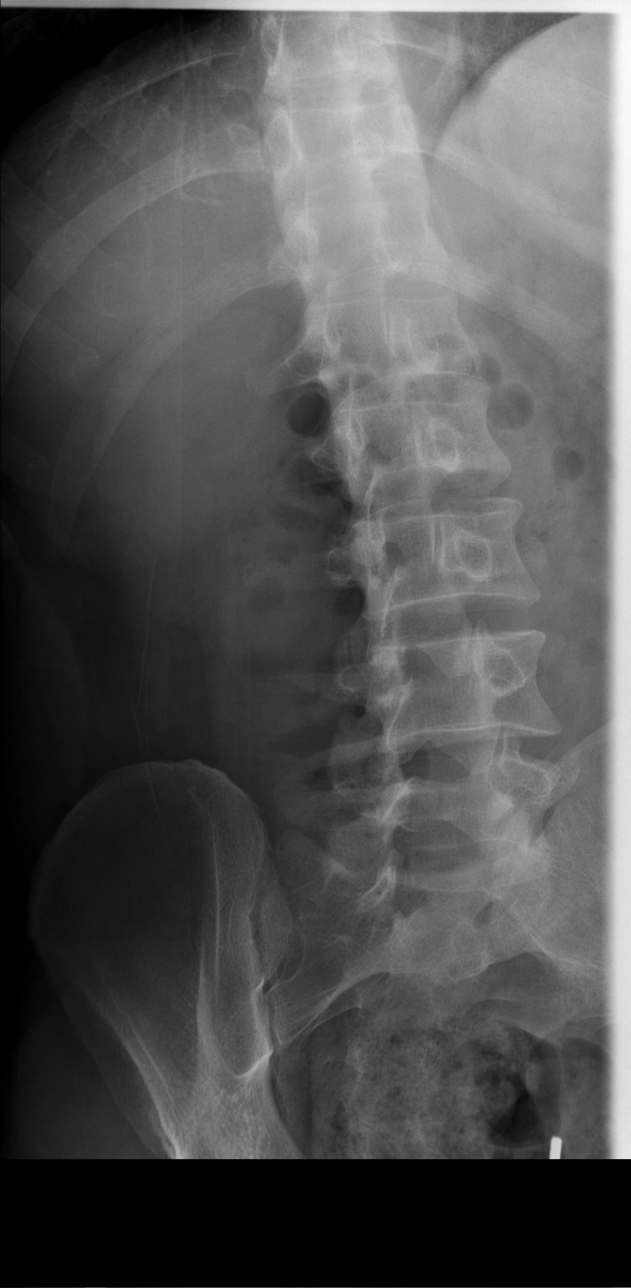

[t lumbar spine lat]
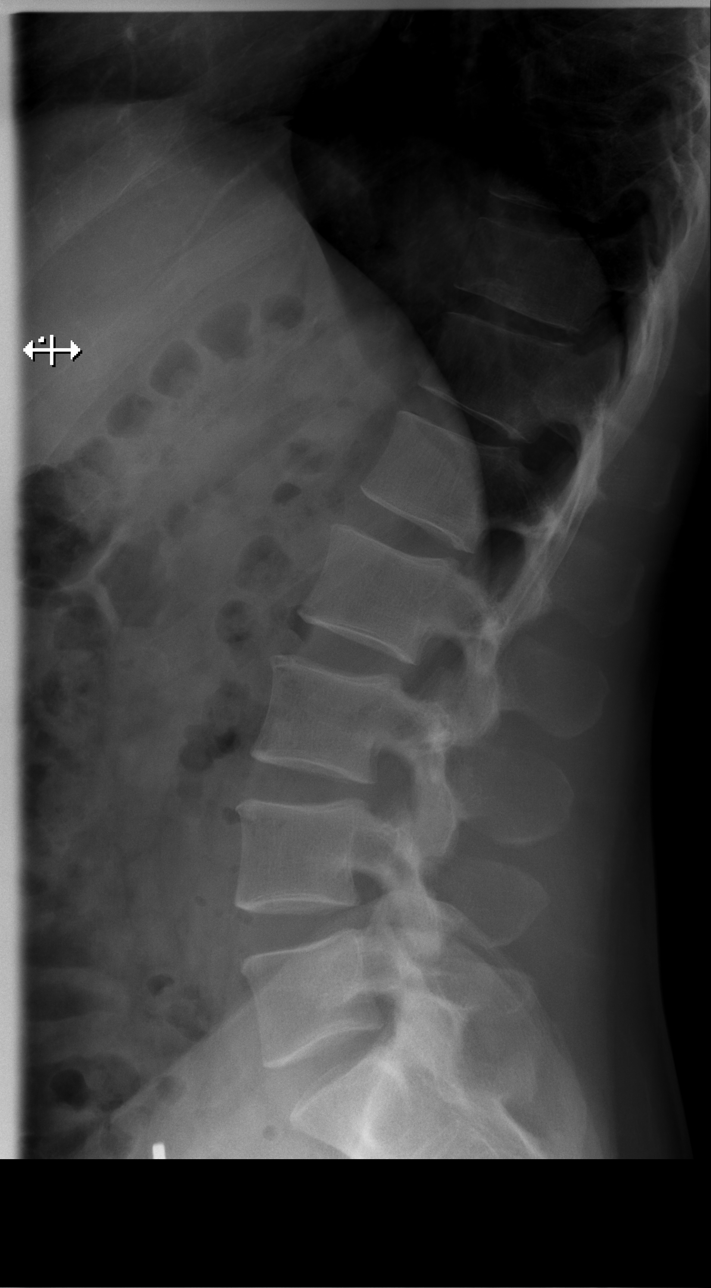

[t lumbar l-5 s-1 spot]
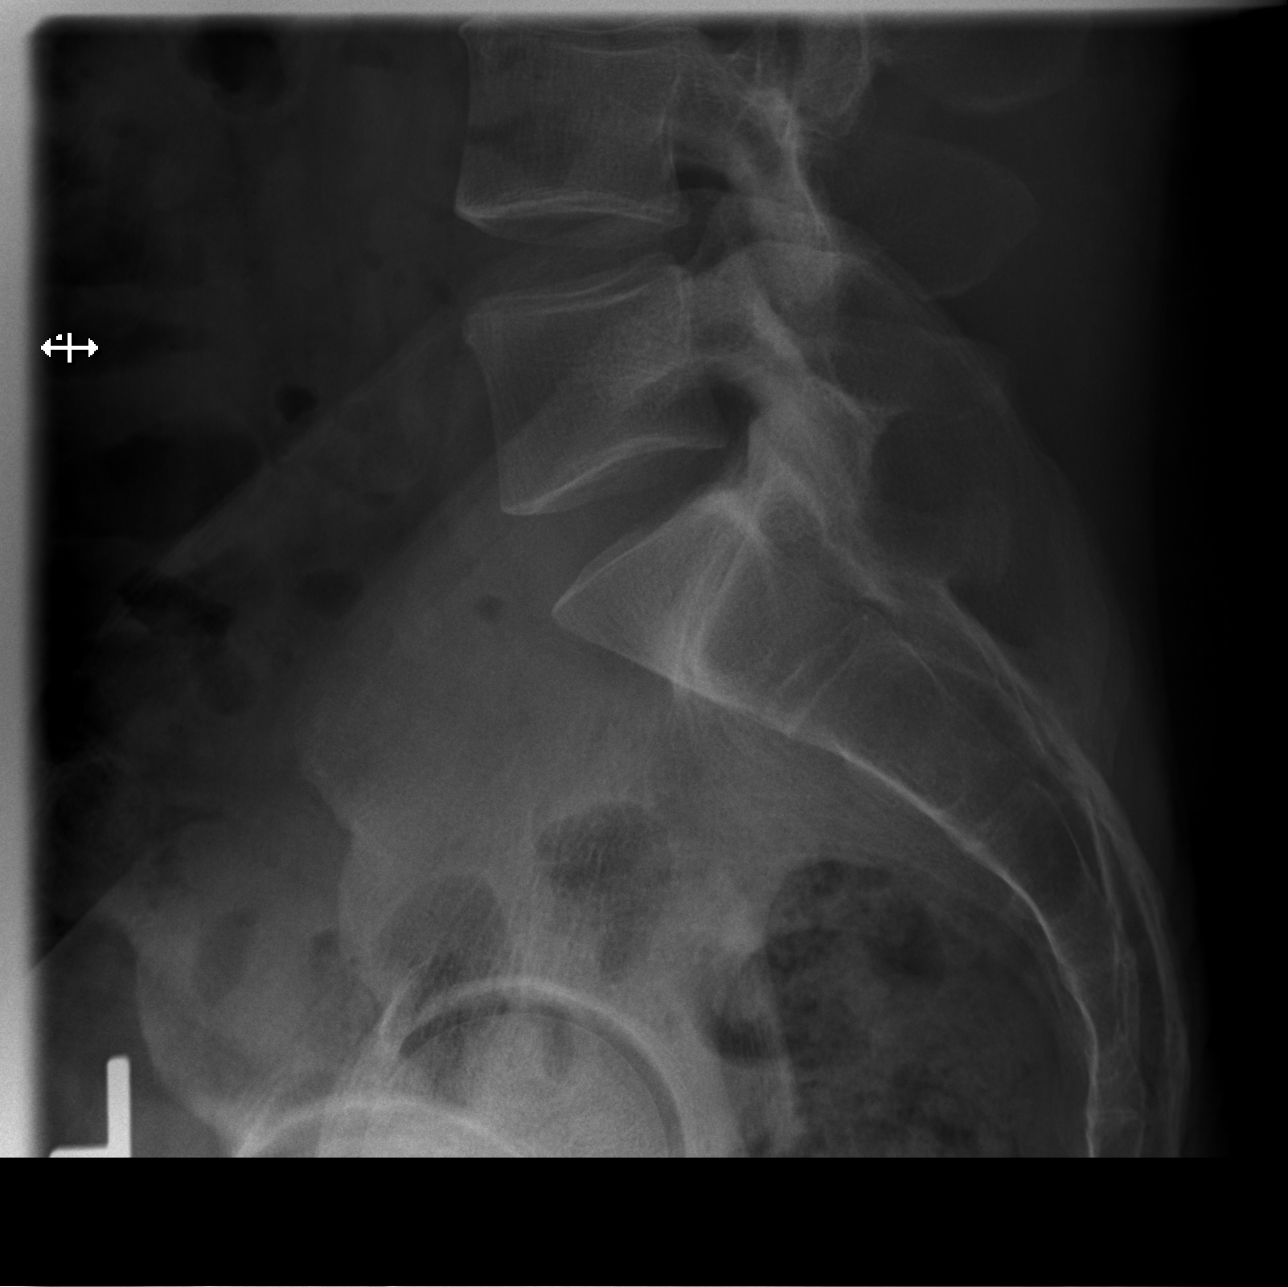

[5 of 5 positions shown; findings below may reference images not displayed]

FINDINGS: Thoracic spine:

Normal alignment of the thoracic vertebral bodies. Disc spaces and
vertebral bodies are maintained. No acute compression fracture. No
abnormal paraspinal soft tissue thickening. The visualized posterior
ribs are intact.

Lumbar spine:

Normal alignment of the lumbar vertebral bodies. Disc spaces and
vertebral bodies are maintained. The facets are normally aligned. No
pars defects. The visualized bony pelvis is intact.
IMPRESSION: Normal alignment and no acute bony findings.

## 2020-12-07 DEATH — deceased
